# Patient Record
Sex: Male | Born: 1960 | Race: Black or African American | Hispanic: No | Smoking: Never smoker
Health system: Southern US, Community
[De-identification: ages and names within clinical notes are randomized; demographics above are authoritative.]

## PROBLEM LIST (undated history)

## (undated) DIAGNOSIS — T7840XA Allergy, unspecified, initial encounter: Secondary | ICD-10-CM

## (undated) DIAGNOSIS — T8859XA Other complications of anesthesia, initial encounter: Secondary | ICD-10-CM

## (undated) DIAGNOSIS — E119 Type 2 diabetes mellitus without complications: Secondary | ICD-10-CM

## (undated) DIAGNOSIS — G510 Bell's palsy: Secondary | ICD-10-CM

## (undated) DIAGNOSIS — E785 Hyperlipidemia, unspecified: Secondary | ICD-10-CM

## (undated) DIAGNOSIS — K648 Other hemorrhoids: Secondary | ICD-10-CM

## (undated) HISTORY — PX: COLONOSCOPY: SHX174

## (undated) HISTORY — PX: KNEE SURGERY: SHX244

## (undated) HISTORY — DX: Other hemorrhoids: K64.8

## (undated) HISTORY — DX: Allergy, unspecified, initial encounter: T78.40XA

## (undated) HISTORY — PX: HERNIA REPAIR: SHX51

## (undated) HISTORY — PX: CIRCUMCISION: SUR203

## (undated) HISTORY — DX: Bell's palsy: G51.0

## (undated) HISTORY — PX: OTHER SURGICAL HISTORY: SHX169

## (undated) HISTORY — DX: Hyperlipidemia, unspecified: E78.5

## (undated) HISTORY — DX: Type 2 diabetes mellitus without complications: E11.9

---

## 2016-02-26 DIAGNOSIS — G510 Bell's palsy: Secondary | ICD-10-CM

## 2016-02-26 HISTORY — DX: Bell's palsy: G51.0

## 2017-03-30 ENCOUNTER — Encounter (HOSPITAL_BASED_OUTPATIENT_CLINIC_OR_DEPARTMENT_OTHER): Payer: Self-pay | Admitting: *Deleted

## 2017-03-30 ENCOUNTER — Other Ambulatory Visit: Payer: Self-pay

## 2017-03-30 ENCOUNTER — Emergency Department (HOSPITAL_BASED_OUTPATIENT_CLINIC_OR_DEPARTMENT_OTHER)
Admission: EM | Admit: 2017-03-30 | Discharge: 2017-03-30 | Disposition: A | Discharge status: Home / Self Care | Payer: BLUE CROSS/BLUE SHIELD | Attending: Emergency Medicine | Admitting: Emergency Medicine

## 2017-03-30 DIAGNOSIS — K648 Other hemorrhoids: Secondary | ICD-10-CM | POA: Diagnosis not present

## 2017-03-30 DIAGNOSIS — K625 Hemorrhage of anus and rectum: Secondary | ICD-10-CM | POA: Diagnosis present

## 2017-03-30 MED ORDER — DOCUSATE SODIUM 100 MG PO CAPS: 100.0000 mg | ORAL_CAPSULE | Freq: Two times a day (BID) | ORAL | 0 refills | Status: DC

## 2017-03-30 MED ORDER — HYDROCORTISONE ACETATE 25 MG RE SUPP: 25.0000 mg | Freq: Two times a day (BID) | RECTAL | 0 refills | Status: DC

## 2017-03-30 NOTE — ED Provider Notes (Signed)
[facility] EMERGENCY DEPARTMENT Provider Note   CSN: [id number] Arrival date & time: 03/30/17  0004     History   Chief Complaint Chief Complaint  Patient presents with  . Rectal Bleeding    HPI Anthony Mills is a 57 y.o. male.  HPI  This is a 57 year old male who presents with hemorrhoids.  Patient reports that he had a hard bowel movement which he strained for yesterday.  Since that time he has noted a bulging hemorrhoid and rectal bleeding.  He has a history of the same.  He states that previously that have gone away on their own.  He took some fiber but reports pain mostly with sitting.  He is a Administrator.  He has not had any recurrent bleeding today.  Her pain is rated at 8 out of 10.  History reviewed. No pertinent past medical history.  There are no active problems to display for this patient.   History reviewed. No pertinent surgical history.     Home Medications    Prior to Admission medications   Medication Sig Start Date End Date Taking? Authorizing Provider  docusate sodium (COLACE) 100 MG capsule Take 1 capsule (100 mg total) by mouth every 12 (twelve) hours. 03/30/17   Horton, Barbette Hair, MD  hydrocortisone (ANUSOL-HC) 25 MG suppository Place 1 suppository (25 mg total) rectally 2 (two) times daily. 03/30/17   Horton, Barbette Hair, MD    Family History No family history on file.  Social History Social History   Tobacco Use  . Smoking status: Never Smoker  . Smokeless tobacco: Never Used  Substance Use Topics  . Alcohol use: No    Frequency: Never  . Drug use: No     Allergies   Codeine   Review of Systems Review of Systems  Constitutional: Negative for fever.  Gastrointestinal: Positive for anal bleeding and rectal pain. Negative for abdominal pain and diarrhea.  All other systems reviewed and are negative.    Physical Exam Updated Vital Signs BP 108/79 (BP Location: Left Arm)   Pulse 96   Temp 100.1 F (37.8 C) (Oral)    Resp 16   Ht 5\' 11"  (1.803 m)   Wt 83.9 kg (185 lb)   SpO2 96%   BMI 25.80 kg/m   Physical Exam  Constitutional: He is oriented to person, place, and time. He appears well-developed and well-nourished. No distress.  HENT:  Head: Normocephalic and atraumatic.  Cardiovascular: Normal rate, regular rhythm and normal heart sounds.  Pulmonary/Chest: Effort normal. No respiratory distress.  Abdominal: Soft. Bowel sounds are normal. There is no tenderness. There is no rebound.  Genitourinary: Rectal exam shows external hemorrhoid and tenderness. Rectal exam shows no fissure.     Genitourinary Comments: Rectal exam was notable for 2 large external hemorrhoids, no obvious thrombosis, no active bleeding, no tenderness otherwise or fluctuance adjacent to anus, digital exam deferred secondary to pain  Musculoskeletal: He exhibits no edema.  Neurological: He is alert and oriented to person, place, and time.  Skin: Skin is warm and dry.  Psychiatric: He has a normal mood and affect.  Nursing note and vitals reviewed.    ED Treatments / Results  Labs (all labs ordered are listed, but only abnormal results are displayed) Labs Reviewed - No data to display  EKG  EKG Interpretation None       Radiology No results found.  Procedures Procedures (including critical care time)  Medications Ordered in ED Medications -  No data to display   Initial Impression / Assessment and Plan / ED Course  I have reviewed the triage vital signs and the nursing notes.  Pertinent labs & imaging results that were available during my care of the patient were reviewed by me and considered in my medical decision making (see chart for details).    Patient presents with bleeding external hemorrhoids.  He is nontoxic on exam.  Vital signs reassuring.  He has 2 large external hemorrhoids that are not actively bleeding and do not appear thrombosed.  They are tender to touch.  No adjacent fluctuance  concerning for infection.  Discussed with patient supportive measures.  Recommend Colace and laxative for soft stools.  Sitz baths.  Anusol suppositories.  Surgical follow-up.  After history, exam, and medical workup I feel the patient has been appropriately medically screened and is safe for discharge home. Pertinent diagnoses were discussed with the patient. Patient was given return precautions.   Final Clinical Impressions(s) / ED Diagnoses   Final diagnoses:  Other hemorrhoids    ED Discharge Orders        Ordered    docusate sodium (COLACE) 100 MG capsule  Every 12 hours     03/30/17 0132    hydrocortisone (ANUSOL-HC) 25 MG suppository  2 times daily     03/30/17 0132       Merryl Hacker, MD 03/30/17 [phone]

## 2017-03-30 NOTE — ED Notes (Signed)
EDP into room, prior to RN assessment, see MD notes, pending orders.   

## 2017-03-30 NOTE — ED Triage Notes (Signed)
Pt reports hemorrhoid swelling after BM on Friday. Reports still having bleeding and swelling and unable to have BM due to this

## 2017-03-30 NOTE — Discharge Instructions (Signed)
You have evidence of hemorrhoids.  You need to take stool softeners and make sure your bowel movements are soft and you are not straining.  Sitz bath 2-3 times days recommended.  Use hydrocortisone suppositories.  Follow-up with general surgery.

## 2017-03-30 NOTE — ED Notes (Signed)
Alert, NAD, calm, interactive, resps e/u, speaking in clear complete sentences, no dyspnea noted, skin W&D, VSS, c/o rectal pain, rectal bleeding and hemorrhoids, (denies: sob, NVD, fever, "feeling cold", dizziness, weakness, diaphoresis or visual changes). Seen by EDP prior to RN assessment, denies questions or needs.

## 2017-04-18 ENCOUNTER — Encounter: Payer: Self-pay | Admitting: Gastroenterology

## 2017-04-25 ENCOUNTER — Other Ambulatory Visit: Payer: Self-pay

## 2017-04-25 ENCOUNTER — Ambulatory Visit (AMBULATORY_SURGERY_CENTER): Payer: Self-pay

## 2017-04-25 VITALS — Ht 72.0 in | Wt 188.8 lb

## 2017-04-25 DIAGNOSIS — Z1211 Encounter for screening for malignant neoplasm of colon: Secondary | ICD-10-CM

## 2017-04-25 DIAGNOSIS — G510 Bell's palsy: Secondary | ICD-10-CM

## 2017-04-25 MED ORDER — NA SULFATE-K SULFATE-MG SULF 17.5-3.13-1.6 GM/177ML PO SOLN: 1.0000 | Freq: Once | ORAL | 0 refills | Status: AC

## 2017-05-09 ENCOUNTER — Encounter: Payer: BLUE CROSS/BLUE SHIELD | Admitting: Gastroenterology

## 2017-07-03 DIAGNOSIS — Z8669 Personal history of other diseases of the nervous system and sense organs: Secondary | ICD-10-CM | POA: Insufficient documentation

## 2017-07-18 ENCOUNTER — Encounter: Payer: BLUE CROSS/BLUE SHIELD | Admitting: Gastroenterology

## 2017-09-18 ENCOUNTER — Encounter: Payer: BLUE CROSS/BLUE SHIELD | Admitting: Gastroenterology

## 2018-01-09 ENCOUNTER — Ambulatory Visit (AMBULATORY_SURGERY_CENTER): Payer: Self-pay | Admitting: *Deleted

## 2018-01-09 ENCOUNTER — Other Ambulatory Visit: Payer: Self-pay

## 2018-01-09 VITALS — Ht 70.0 in | Wt 190.8 lb

## 2018-01-09 DIAGNOSIS — Z1211 Encounter for screening for malignant neoplasm of colon: Secondary | ICD-10-CM

## 2018-01-09 MED ORDER — SUPREP BOWEL PREP KIT 17.5-3.13-1.6 GM/177ML PO SOLN
1.0000 | Freq: Once | ORAL | 0 refills | Status: AC
Start: 2018-01-09 — End: 2018-01-09

## 2018-01-09 NOTE — Progress Notes (Signed)
Patient denies any allergies to egg or soy products. Patient denies complications with anesthesia/sedation.  Patient denies oxygen use at home and denies diet medications. Pamphlet given on colonoscopy. 

## 2018-01-12 ENCOUNTER — Encounter: Payer: Self-pay | Admitting: Gastroenterology

## 2018-01-16 ENCOUNTER — Encounter: Payer: BLUE CROSS/BLUE SHIELD | Admitting: Gastroenterology

## 2018-02-10 ENCOUNTER — Other Ambulatory Visit: Payer: Self-pay

## 2018-02-10 ENCOUNTER — Telehealth: Payer: Self-pay | Admitting: Gastroenterology

## 2018-02-10 NOTE — Telephone Encounter (Signed)
Letter mailed

## 2018-02-20 ENCOUNTER — Other Ambulatory Visit: Payer: Self-pay

## 2018-02-20 ENCOUNTER — Encounter: Payer: BLUE CROSS/BLUE SHIELD | Admitting: Gastroenterology

## 2018-02-20 NOTE — Telephone Encounter (Signed)
Patient rescheduled his procedure to 1.10.2020 and would like this letter rewrote for work. Patient states he can come by today and pick it up.

## 2018-02-20 NOTE — Telephone Encounter (Signed)
Patient calling back about this to see if he can come by today to pick up updated letter. States he needs a letter to be off day before and day of procedure. Best call back# [phone].

## 2018-02-20 NOTE — Telephone Encounter (Signed)
Letter re-written. Patient contacted. He is coming for the letter.

## 2018-02-26 ENCOUNTER — Encounter: Payer: Self-pay | Admitting: Gastroenterology

## 2018-02-26 ENCOUNTER — Other Ambulatory Visit: Payer: Self-pay

## 2018-02-26 NOTE — Telephone Encounter (Signed)
Pt states now that colon appt has been moved to 03/13/18 needs a new letter stating last appointment was moved due to construction and has been moved to 03/13/18. Pt is asking we mail updated letter to him.

## 2018-02-26 NOTE — Telephone Encounter (Signed)
New letter mailed.

## 2018-02-27 ENCOUNTER — Encounter: Payer: BLUE CROSS/BLUE SHIELD | Admitting: Gastroenterology

## 2018-03-06 ENCOUNTER — Encounter: Payer: BLUE CROSS/BLUE SHIELD | Admitting: Gastroenterology

## 2018-03-13 ENCOUNTER — Ambulatory Visit (AMBULATORY_SURGERY_CENTER): Payer: BLUE CROSS/BLUE SHIELD | Admitting: Gastroenterology

## 2018-03-13 ENCOUNTER — Encounter: Payer: Self-pay | Admitting: Gastroenterology

## 2018-03-13 VITALS — BP 100/81 | HR 70 | Temp 98.7°F | Resp 14

## 2018-03-13 DIAGNOSIS — Z1211 Encounter for screening for malignant neoplasm of colon: Secondary | ICD-10-CM | POA: Diagnosis not present

## 2018-03-13 MED ORDER — FLEET ENEMA 7-19 GM/118ML RE ENEM: 1.0000 | ENEMA | Freq: Once | RECTAL | 0 refills | Status: AC

## 2018-03-13 NOTE — Progress Notes (Signed)
Pt. Not awake when Dr. Havery Moros came to give report to the pt.   Kept pt. For consult when Dr. Havery Moros available after subsequent procedure.  Hence disparity in discharge time.

## 2018-03-13 NOTE — Progress Notes (Signed)
PT taken to PACU. Monitors in place. VSS. Report given to RN. 

## 2018-03-13 NOTE — Patient Instructions (Signed)
Impression/Recommendations:  Hemorrhoid handout given to patient.  Resume previous diet. Continue present medications.  Repeat colonoscopy within the next year because the bowel preparation was suboptimal.  YOU HAD AN ENDOSCOPIC PROCEDURE TODAY AT [facility]:   Refer to the procedure report that was given to you for any specific questions about what was found during the examination.  If the procedure report does not answer your questions, please call your gastroenterologist to clarify.  If you requested that your care partner not be given the details of your procedure findings, then the procedure report has been included in a sealed envelope for you to review at your convenience later.  YOU SHOULD EXPECT: Some feelings of bloating in the abdomen. Passage of more gas than usual.  Walking can help get rid of the air that was put into your GI tract during the procedure and reduce the bloating. If you had a lower endoscopy (such as a colonoscopy or flexible sigmoidoscopy) you may notice spotting of blood in your stool or on the toilet paper. If you underwent a bowel prep for your procedure, you may not have a normal bowel movement for a few days.  Please Note:  You might notice some irritation and congestion in your nose or some drainage.  This is from the oxygen used during your procedure.  There is no need for concern and it should clear up in a day or so.  SYMPTOMS TO REPORT IMMEDIATELY:   Following lower endoscopy (colonoscopy or flexible sigmoidoscopy):  Excessive amounts of blood in the stool  Significant tenderness or worsening of abdominal pains  Swelling of the abdomen that is new, acute  Fever of 100F or higher For urgent or emergent issues, a gastroenterologist can be reached at any hour by calling [phone].   DIET:  We do recommend a small meal at first, but then you may proceed to your regular diet.  Drink plenty of fluids but you should avoid alcoholic  beverages for 24 hours.  ACTIVITY:  You should plan to take it easy for the rest of today and you should NOT DRIVE or use heavy machinery until tomorrow (because of the sedation medicines used during the test).    FOLLOW UP: Our staff will call the number listed on your records the next business day following your procedure to check on you and address any questions or concerns that you may have regarding the information given to you following your procedure. If we do not reach you, we will leave a message.  However, if you are feeling well and you are not experiencing any problems, there is no need to return our call.  We will assume that you have returned to your regular daily activities without incident.  If any biopsies were taken you will be contacted by phone or by letter within the next 1-3 weeks.  Please call us at [phone] if you have not heard about the biopsies in 3 weeks.    SIGNATURES/CONFIDENTIALITY: You and/or your care partner have signed paperwork which will be entered into your electronic medical record.  These signatures attest to the fact that that the information above on your After Visit Summary has been reviewed and is understood.  Full responsibility of the confidentiality of this discharge information lies with you and/or your care-partner.

## 2018-03-13 NOTE — Op Note (Signed)
[facility] Patient Name: Unknown Schleyer Procedure Date: 03/13/2018 9:42 AM MRN: [id number] Endoscopist: Remo Lipps P. Havery Moros , MD Age: 58 Referring MD:  Date of Birth: Jul 06, 1960 Gender: Male Account #: [id number] Procedure:                Colonoscopy Indications:              Screening for colorectal malignant neoplasm Medicines:                Monitored Anesthesia Care Procedure:                Pre-Anesthesia Assessment:                           - Prior to the procedure, a History and Physical                            was performed, and patient medications and                            allergies were reviewed. The patient's tolerance of                            previous anesthesia was also reviewed. The risks                            and benefits of the procedure and the sedation                            options and risks were discussed with the patient.                            All questions were answered, and informed consent                            was obtained. Prior Anticoagulants: The patient has                            taken no previous anticoagulant or antiplatelet                            agents. ASA Grade Assessment: II - A patient with                            mild systemic disease. After reviewing the risks                            and benefits, the patient was deemed in                            satisfactory condition to undergo the procedure.                           After obtaining informed consent, the colonoscope  was passed under direct vision. Throughout the                            procedure, the patient's blood pressure, pulse, and                            oxygen saturations were monitored continuously. The                            Colonoscope was introduced through the anus and                            advanced to the the cecum, identified by                            appendiceal orifice and  ileocecal valve. The                            colonoscopy was performed without difficulty. The                            patient tolerated the procedure well. The quality                            of the bowel preparation was fair. The ileocecal                            valve, appendiceal orifice, and rectum were                            photographed. Scope In: 9:49:16 AM Scope Out: 10:10:47 AM Scope Withdrawal Time: 0 hours 17 minutes 54 seconds  Total Procedure Duration: 0 hours 21 minutes 31 seconds  Findings:                 The perianal and digital rectal examinations were                            normal.                           A large amount of semi-liquid stool was found in                            the entire colon, making visualization difficult.                            Lavage of the area was performed using copious                            amounts of sterile water, resulting in clearance in                            most areas with fair visualization, a few areas  were difficult to visualize, particularly the cecal                            cap.                           Internal hemorrhoids were found during                            retroflexion. The hemorrhoids were moderate.                           The exam was otherwise without abnormality. No                            polyps Complications:            No immediate complications. Estimated blood loss:                            None. Estimated Blood Loss:     Estimated blood loss: none. Impression:               - Preparation of the colon was fair.                           - Internal hemorrhoids.                           - The examination was otherwise normal.                           Overall no large polyps or mass lesions, however                            small or very flat lesions may not have been                            appreciated given  preparation. Recommendation:           - Patient has a contact number available for                            emergencies. The signs and symptoms of potential                            delayed complications were discussed with the                            patient. Return to normal activities tomorrow.                            Written discharge instructions were provided to the                            patient.                           -  Resume previous diet.                           - Continue present medications.                           - Repeat colonoscopy within the next year or so                            because the bowel preparation was suboptimal. Remo Lipps P. Armbruster, MD 03/13/2018 10:15:59 AM This report has been signed electronically.

## 2018-03-16 ENCOUNTER — Telehealth: Payer: Self-pay

## 2018-03-16 NOTE — Telephone Encounter (Signed)
Called pt.

## 2018-03-16 NOTE — Telephone Encounter (Signed)
Called [phone] and left a messaged we tried to reach pt for a follow up call. maw

## 2018-03-16 NOTE — Telephone Encounter (Signed)
He needs a Friday appt. Scheduled him for 04-17-18 at 3:30pm. Will wait for the March schedule to open to schedule more banding appts on Fridays

## 2018-03-16 NOTE — Telephone Encounter (Signed)
Follow up call made, name identifier, left a voicemail. 

## 2018-03-16 NOTE — Telephone Encounter (Signed)
-----   Message from Yetta Flock, MD sent at 03/13/2018  1:30 PM EST ----- Regarding: banding Jan would you mind helping coordinate a banding for this patient? No rush, can call next week. thanks

## 2018-03-18 DIAGNOSIS — D8683 Sarcoid iridocyclitis: Secondary | ICD-10-CM | POA: Insufficient documentation

## 2018-03-18 DIAGNOSIS — H3581 Retinal edema: Secondary | ICD-10-CM | POA: Insufficient documentation

## 2018-03-18 DIAGNOSIS — H30033 Focal chorioretinal inflammation, peripheral, bilateral: Secondary | ICD-10-CM | POA: Insufficient documentation

## 2018-03-18 DIAGNOSIS — D869 Sarcoidosis, unspecified: Secondary | ICD-10-CM | POA: Insufficient documentation

## 2018-03-18 DIAGNOSIS — H3023 Posterior cyclitis, bilateral: Secondary | ICD-10-CM | POA: Insufficient documentation

## 2018-03-18 DIAGNOSIS — H35063 Retinal vasculitis, bilateral: Secondary | ICD-10-CM | POA: Insufficient documentation

## 2018-03-18 DIAGNOSIS — H524 Presbyopia: Secondary | ICD-10-CM | POA: Insufficient documentation

## 2018-03-18 DIAGNOSIS — H16223 Keratoconjunctivitis sicca, not specified as Sjogren's, bilateral: Secondary | ICD-10-CM | POA: Insufficient documentation

## 2018-03-18 DIAGNOSIS — H2513 Age-related nuclear cataract, bilateral: Secondary | ICD-10-CM | POA: Insufficient documentation

## 2018-03-18 DIAGNOSIS — J8410 Pulmonary fibrosis, unspecified: Secondary | ICD-10-CM | POA: Insufficient documentation

## 2018-03-18 HISTORY — DX: Sarcoid iridocyclitis: D86.83

## 2018-04-17 ENCOUNTER — Encounter: Payer: BLUE CROSS/BLUE SHIELD | Admitting: Gastroenterology

## 2018-07-28 ENCOUNTER — Ambulatory Visit: Payer: BC Managed Care – PPO | Admitting: Pediatrics

## 2018-07-28 ENCOUNTER — Other Ambulatory Visit: Payer: Self-pay

## 2018-07-28 ENCOUNTER — Encounter: Payer: Self-pay | Admitting: Pediatrics

## 2018-07-28 VITALS — BP 116/70 | HR 82 | Temp 98.5°F | Resp 16 | Ht 70.16 in | Wt 184.8 lb

## 2018-07-28 DIAGNOSIS — J301 Allergic rhinitis due to pollen: Secondary | ICD-10-CM

## 2018-07-28 DIAGNOSIS — H1010 Acute atopic conjunctivitis, unspecified eye: Secondary | ICD-10-CM | POA: Diagnosis not present

## 2018-07-28 MED ORDER — FLUTICASONE PROPIONATE 50 MCG/ACT NA SUSP: NASAL | 5 refills | Status: DC

## 2018-07-28 NOTE — Progress Notes (Signed)
[facility] [address] Dept: [phone]  New Patient Note  Patient ID: Anthony Mills, male    DOB: [birth year]  Age: 58 y.o. MRN: [id number] Date of Office Visit: 07/28/2018 Referring provider: No referring provider defined for this encounter.    Chief Complaint: Allergic Rhinitis   HPI [name] presents for evaluation of allergic rhinitis.  He began to have allergic rhinitis in 1997.  His symptoms are worse in the springtime and also associated with a runny nose, stuffy nose, sneezing.  He also has itchy burning eyes in the springtime.  He has used Naphcon-A eyedrops with some improvement. His symptoms have been very severe this  springtime and have not resolved as usual.  He has aggravation of his nasal symptoms on exposure to the springtime and exposure to dust.  He has not had asthmatic symptoms.  There is no history of eczema, chronic urticaria or gastroesophageal reflux.  Review of Systems  Constitutional: Negative.   HENT:       Seasonal allergic rhinitis since 1997  Eyes:       Itchy eyes in the springtime  Respiratory: Negative.   Cardiovascular: Negative.   Gastrointestinal: Negative.   Genitourinary: Negative.   Musculoskeletal:       Knee surgery in the past.  Umbilical hernia repair  Skin: Negative.   Neurological: Negative.   Endo/Heme/Allergies:       No diabetes or thyroid disease  Psychiatric/Behavioral: Negative.     Outpatient Encounter Medications as of 07/28/2018  Medication Sig  . Naphazoline-Pheniramine (ALLERGY EYE OP) Apply to eye daily.  . fluticasone (FLONASE) 50 MCG/ACT nasal spray 2 sprays per nostril once a day if needed for stuffy nose  . [DISCONTINUED] docusate sodium (COLACE) 100 MG capsule Take 1 capsule (100 mg total) by mouth every 12 (twelve) hours. (Patient not taking: Reported on 01/09/2018)  . [DISCONTINUED] omeprazole (PRILOSEC) 40 MG capsule   . [DISCONTINUED] prednisoLONE acetate (PRED FORTE) 1 %  ophthalmic suspension   . [DISCONTINUED] rosuvastatin (CRESTOR) 20 MG tablet Take by mouth.   No facility-administered encounter medications on file as of 07/28/2018.      Drug Allergies:  Allergies  Allergen Reactions  . Codeine Other (See Comments)    hallucinate    Family History: Chadley's family history includes Asthma in his nephew..  Family history is negative for hay fever, sinus problems, angioedema, eczema, hives, food allergies, chronic bronchitis or emphysema.  Social and environmental.  There are no pets in the home.  He is not exposed to cigarette smoking.  He smoked for 1 year in the past when he was 58 years of age.  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 He is normal weight.  HENT:     Head:     Comments: Eyes showed mild erythema of the palpebral conjunctiva .  Ears normal.  Nose mild swelling of the nasal turbinates.  Pharynx normal. Neck:     Musculoskeletal: Neck supple.     Comments: Thyroid normal Cardiovascular:     Rate and Rhythm: Normal rate and regular rhythm.     Comments: S1-S2 normal no  murmurs Pulmonary:     Comments: Clear to percussion and auscultation Abdominal:     Palpations: Abdomen is soft.     Tenderness: There is no abdominal tenderness.     Comments: No hepatosplenomegaly  Lymphadenopathy:     Cervical: No cervical adenopathy.  Skin:    Comments: Clear  Neurological:     General: No focal deficit present.     Mental Status: He is alert and oriented to person, place, and time.  Psychiatric:        Mood and Affect: Mood normal.        Behavior: Behavior normal.        Thought Content: Thought content normal.        Judgment: Judgment normal.     Diagnostics: Allergy skin test were positive  to grass pollens, tree pollens, molds, dust mites   Assessment  Assessment and Plan: 1. Seasonal allergic rhinitis due to pollen   2. Seasonal allergic conjunctivitis     Meds ordered this encounter  Medications  . fluticasone (FLONASE) 50 MCG/ACT nasal spray    Sig: 2 sprays per nostril once a day if needed for stuffy nose    Dispense:  16 g    Refill:  5    Patient Instructions  Environmental control of dust mite and mold Allegra 180 mg-take 1 tablet once a day for runny nose or itchy eyes Fluticasone 2 sprays per nostril once a day if needed for stuffy nose Opcon-A-1 drop 3 times a day if needed for itchy eyes Add prednisone 10 mg-take 2 tablets twice a day for 3 days, 2 tablets on the fourth day, 1 tablet on the fifth day to bring your allergic symptoms under control Call us if you are not doing well on this treatment plan   Return in about 4 weeks (around 08/25/2018).   Thank you for the opportunity to care for this patient.  Please do not hesitate to contact me with questions.  Penne Lash, M.D.  [facility] [address] [place],  [address] [phone]

## 2018-07-28 NOTE — Patient Instructions (Addendum)
Environmental control of dust mite and mold Allegra 180 mg-take 1 tablet once a day for runny nose or itchy eyes Fluticasone 2 sprays per nostril once a day if needed for stuffy nose Opcon-A-1 drop 3 times a day if needed for itchy eyes Add prednisone 10 mg-take 2 tablets twice a day for 3 days, 2 tablets on the fourth day, 1 tablet on the fifth day to bring your allergic symptoms under control Call us if you are not doing well on this treatment plan

## 2018-08-11 ENCOUNTER — Ambulatory Visit: Payer: BC Managed Care – PPO | Admitting: Pediatrics

## 2018-08-24 ENCOUNTER — Other Ambulatory Visit: Payer: Self-pay

## 2018-08-24 ENCOUNTER — Encounter: Payer: Self-pay | Admitting: Pediatrics

## 2018-08-24 ENCOUNTER — Ambulatory Visit: Payer: BC Managed Care – PPO | Admitting: Pediatrics

## 2018-08-24 VITALS — BP 112/70 | HR 102 | Temp 97.7°F | Resp 20

## 2018-08-24 DIAGNOSIS — J4531 Mild persistent asthma with (acute) exacerbation: Secondary | ICD-10-CM

## 2018-08-24 DIAGNOSIS — J301 Allergic rhinitis due to pollen: Secondary | ICD-10-CM

## 2018-08-24 DIAGNOSIS — H1010 Acute atopic conjunctivitis, unspecified eye: Secondary | ICD-10-CM | POA: Diagnosis not present

## 2018-08-24 MED ORDER — MONTELUKAST SODIUM 10 MG PO TABS: ORAL_TABLET | ORAL | 5 refills | Status: DC

## 2018-08-24 MED ORDER — ALBUTEROL SULFATE HFA 108 (90 BASE) MCG/ACT IN AERS: INHALATION_SPRAY | RESPIRATORY_TRACT | 1 refills | Status: DC

## 2018-08-24 MED ORDER — AMOXICILLIN-POT CLAVULANATE 875-125 MG PO TABS: ORAL_TABLET | ORAL | 0 refills | Status: DC

## 2018-08-24 NOTE — Patient Instructions (Addendum)
Allegra 180 mg-take 1 tablet once a day for runny nose or itchy eyes Nasal saline irrigations at night followed by fluticasone 2 sprays per nostril at night Opcon-A-1 drop 3 times a day if needed for itchy eyes Montelukast 10 mg-take 1 tablet once a day to prevent coughing or wheezing Pro-air 2 puffs every 4 hours if needed for wheezing or coughing spells.  You may use Pro-air 2 puffs 5 to 15 minutes before exercise Add prednisone 10 mg tablet-take 2 tablets twice a day for 3 days, 2 tablets on the fourth day, 1 tablet on the fifth day Augmentin 875 mg-take 1 tablet every 12 hours for 10 days for infection Continue on  your other medications Call us if you are not doing well on this treatment plan

## 2018-08-24 NOTE — Progress Notes (Signed)
[address] [place]  [address] Dept: [phone]  FOLLOW UP NOTE  Patient ID: Anthony Mills, male    DOB: March 11, 1960  Age: 58 y.o. MRN: [id number] Date of Office Visit: 08/24/2018  Assessment  Chief Complaint: Allergic Rhinitis   HPI Anthony Mills presents for follow-up of allergic rhinitis.  While he was on prednisone , the sinus pressure improved but then it  got worse.  He is on Allegra 180 mg once a day and fluticasone 2 sprays per nostril once a day.  Over the past 2 weeks he has had a cough  ,some chest congestion and some noises coming out of his chest.  He has never had a history of asthma.   Drug Allergies:  Allergies  Allergen Reactions  . Codeine Other (See Comments)    hallucinate    Physical Exam: BP 112/70   Pulse (!) 102   Temp 97.7 F (36.5 C) (Oral)   Resp 20   SpO2 97%    Physical Exam Constitutional:      Appearance: Normal appearance. He is normal weight.  HENT:     Head:     Comments: Eyes normal.  Ears normal.  Nose mild swelling nasal turbinates.  Pharynx normal., except for yellow postnasal drainage Neck:     Musculoskeletal: Neck supple.  Cardiovascular:     Comments: S1-S2 normal no murmur Pulmonary:     Comments: Clear to percussion auscultation except for some rhonchi in both lungs Lymphadenopathy:     Cervical: No cervical adenopathy.  Neurological:     General: No focal deficit present.     Mental Status: He is alert and oriented to person, place, and time.  Psychiatric:        Mood and Affect: Mood normal.        Behavior: Behavior normal.        Thought Content: Thought content normal.        Judgment: Judgment normal.     Diagnostics: FVC 2.98 L FEV1 2.36 L.  Predicted FVC 4.08 L predicted FEV1 3.21 L.  After albuterol 4 puffs FVC 3.26 L FEV1 2.69 L-this shows a mild reduction in the forced vital capacity.  There was significant improvement in the forced vital capacity and FEV1 after albuterol  Assessment and  Plan: 1. Mild persistent asthma with acute exacerbation   2. Seasonal allergic conjunctivitis   3. Seasonal allergic rhinitis due to pollen     Meds ordered this encounter  Medications  . montelukast (SINGULAIR) 10 MG tablet    Sig: Take 1 tablet once a day to prevent coughing or wheezing    Dispense:  30 tablet    Refill:  5  . albuterol (PROAIR HFA) 108 (90 Base) MCG/ACT inhaler    Sig: 2 puffs every 4 hours if needed for coughing or wheezing spells    Dispense:  18 g    Refill:  1  . amoxicillin-clavulanate (AUGMENTIN) 875-125 MG tablet    Sig: Take one tablet every 12 hours for 10 days for infection    Dispense:  20 tablet    Refill:  0    Patient Instructions  Allegra 180 mg-take 1 tablet once a day for runny nose or itchy eyes Nasal saline irrigations at night followed by fluticasone 2 sprays per nostril at night Opcon-A-1 drop 3 times a day if needed for itchy eyes Montelukast 10 mg-take 1 tablet once a day to prevent coughing or wheezing Pro-air 2 puffs every 4  hours if needed for wheezing or coughing spells.  You may use Pro-air 2 puffs 5 to 15 minutes before exercise Add prednisone 10 mg tablet-take 2 tablets twice a day for 3 days, 2 tablets on the fourth day, 1 tablet on the fifth day Augmentin 875 mg-take 1 tablet every 12 hours for 10 days for infection Continue on  your other medications Call us if you are not doing well on this treatment plan   Return in about 2 weeks (around 09/07/2018).    Thank you for the opportunity to care for this patient.  Please do not hesitate to contact me with questions.  Penne Lash, M.D.  [facility] [address] [place], [place] [address] [phone]

## 2018-09-01 ENCOUNTER — Ambulatory Visit: Payer: BC Managed Care – PPO | Admitting: Pediatrics

## 2018-09-08 ENCOUNTER — Ambulatory Visit: Payer: BC Managed Care – PPO | Admitting: Pediatrics

## 2018-09-11 DIAGNOSIS — R519 Headache, unspecified: Secondary | ICD-10-CM | POA: Insufficient documentation

## 2018-09-11 DIAGNOSIS — R251 Tremor, unspecified: Secondary | ICD-10-CM | POA: Insufficient documentation

## 2018-09-23 DIAGNOSIS — N50819 Testicular pain, unspecified: Secondary | ICD-10-CM

## 2018-09-23 HISTORY — DX: Testicular pain, unspecified: N50.819

## 2018-09-29 ENCOUNTER — Encounter: Payer: Self-pay | Admitting: Gastroenterology

## 2018-10-09 ENCOUNTER — Encounter: Payer: Self-pay | Admitting: Gastroenterology

## 2018-10-16 ENCOUNTER — Other Ambulatory Visit: Payer: Self-pay

## 2018-10-16 ENCOUNTER — Ambulatory Visit (AMBULATORY_SURGERY_CENTER): Payer: Self-pay | Admitting: *Deleted

## 2018-10-16 VITALS — Temp 97.1°F | Ht 71.0 in | Wt 180.0 lb

## 2018-10-16 DIAGNOSIS — K219 Gastro-esophageal reflux disease without esophagitis: Secondary | ICD-10-CM | POA: Insufficient documentation

## 2018-10-16 DIAGNOSIS — Z1211 Encounter for screening for malignant neoplasm of colon: Secondary | ICD-10-CM

## 2018-10-16 DIAGNOSIS — R1314 Dysphagia, pharyngoesophageal phase: Secondary | ICD-10-CM | POA: Insufficient documentation

## 2018-10-16 MED ORDER — SUPREP BOWEL PREP KIT 17.5-3.13-1.6 GM/177ML PO SOLN: 1.0000 | Freq: Once | ORAL | 0 refills | Status: AC

## 2018-10-16 NOTE — Progress Notes (Signed)
No egg or soy allergy known to patient  No issues with past sedation with any surgeries  or procedures, no intubation problems  No diet pills per patient No home 02 use per patient  No blood thinners per patient  Pt denies issues with constipation  No A fib or A flutter  EMMI video sent to pt's e mail   Suprep $15 coupon to pt in PV today    

## 2018-10-22 ENCOUNTER — Other Ambulatory Visit: Payer: Self-pay

## 2018-10-22 DIAGNOSIS — Z20822 Contact with and (suspected) exposure to covid-19: Secondary | ICD-10-CM

## 2018-10-23 ENCOUNTER — Encounter: Payer: BLUE CROSS/BLUE SHIELD | Admitting: Gastroenterology

## 2018-10-24 LAB — NOVEL CORONAVIRUS, NAA: SARS-CoV-2, NAA: NOT DETECTED

## 2018-10-25 ENCOUNTER — Encounter (HOSPITAL_BASED_OUTPATIENT_CLINIC_OR_DEPARTMENT_OTHER): Payer: Self-pay | Admitting: Emergency Medicine

## 2018-10-25 ENCOUNTER — Emergency Department (HOSPITAL_BASED_OUTPATIENT_CLINIC_OR_DEPARTMENT_OTHER): Payer: BC Managed Care – PPO

## 2018-10-25 ENCOUNTER — Emergency Department (HOSPITAL_BASED_OUTPATIENT_CLINIC_OR_DEPARTMENT_OTHER)
Admission: EM | Admit: 2018-10-25 | Discharge: 2018-10-25 | Disposition: A | Discharge status: Home / Self Care | Payer: BC Managed Care – PPO | Attending: Emergency Medicine | Admitting: Emergency Medicine

## 2018-10-25 ENCOUNTER — Other Ambulatory Visit: Payer: Self-pay

## 2018-10-25 DIAGNOSIS — R51 Headache: Secondary | ICD-10-CM | POA: Diagnosis not present

## 2018-10-25 DIAGNOSIS — E785 Hyperlipidemia, unspecified: Secondary | ICD-10-CM | POA: Diagnosis not present

## 2018-10-25 DIAGNOSIS — Z79899 Other long term (current) drug therapy: Secondary | ICD-10-CM | POA: Insufficient documentation

## 2018-10-25 DIAGNOSIS — Z885 Allergy status to narcotic agent status: Secondary | ICD-10-CM | POA: Diagnosis not present

## 2018-10-25 DIAGNOSIS — R202 Paresthesia of skin: Secondary | ICD-10-CM | POA: Diagnosis not present

## 2018-10-25 DIAGNOSIS — R519 Headache, unspecified: Secondary | ICD-10-CM

## 2018-10-25 LAB — BASIC METABOLIC PANEL
Anion gap: 11 (ref 5–15)
BUN: 10 mg/dL (ref 6–20)
CO2: 24 mmol/L (ref 22–32)
Calcium: 9.4 mg/dL (ref 8.9–10.3)
Chloride: 108 mmol/L (ref 98–111)
Creatinine, Ser: 1.09 mg/dL (ref 0.61–1.24)
GFR calc Af Amer: >60 mL/min (ref >60)
GFR calc non Af Amer: >60 mL/min (ref >60)
Glucose, Bld: 121 mg/dL — ABNORMAL HIGH (ref 70–99)
Potassium: 4 mmol/L (ref 3.5–5.1)
Sodium: 143 mmol/L (ref 135–145)

## 2018-10-25 LAB — CBC WITH DIFFERENTIAL/PLATELET
Abs Immature Granulocytes: 0.05 10*3/uL (ref 0.00–0.07)
Basophils Absolute: 0.1 10*3/uL (ref 0.0–0.1)
Basophils Relative: 1 %
Eosinophils Absolute: 0.1 10*3/uL (ref 0.0–0.5)
Eosinophils Relative: 2 %
HCT: 44.7 % (ref 39.0–52.0)
Hemoglobin: 14.1 g/dL (ref 13.0–17.0)
Immature Granulocytes: 1 %
Lymphocytes Relative: 19 %
Lymphs Abs: 1 10*3/uL (ref 0.7–4.0)
MCH: 26.4 pg (ref 26.0–34.0)
MCHC: 31.5 g/dL (ref 30.0–36.0)
MCV: 83.6 fL (ref 80.0–100.0)
Monocytes Absolute: 0.7 10*3/uL (ref 0.1–1.0)
Monocytes Relative: 13 %
Neutro Abs: 3.4 10*3/uL (ref 1.7–7.7)
Neutrophils Relative %: 64 %
Platelets: 278 10*3/uL (ref 150–400)
RBC: 5.35 MIL/uL (ref 4.22–5.81)
RDW: 13.4 % (ref 11.5–15.5)
WBC: 5.2 10*3/uL (ref 4.0–10.5)
nRBC: 0 % (ref 0.0–0.2)

## 2018-10-25 MED ORDER — DIPHENHYDRAMINE HCL 50 MG/ML IJ SOLN
12.5000 mg | Freq: Once | INTRAMUSCULAR | Status: AC
  Administered 2018-10-25: 12.5 mg via INTRAVENOUS
  Filled 2018-10-25: qty 1

## 2018-10-25 MED ORDER — METOCLOPRAMIDE HCL 5 MG/ML IJ SOLN
10.0000 mg | Freq: Once | INTRAMUSCULAR | Status: AC
  Administered 2018-10-25: 14:00:00 10 mg via INTRAVENOUS
  Filled 2018-10-25: qty 2

## 2018-10-25 MED ORDER — SODIUM CHLORIDE 0.9 % IV BOLUS
500.0000 mL | Freq: Once | INTRAVENOUS | Status: AC
  Administered 2018-10-25: 14:00:00 500 mL via INTRAVENOUS

## 2018-10-25 NOTE — ED Triage Notes (Signed)
Headache for 2 weeks with tongue and jaw numbness. Has been seen by PCP with a full work up and nothing acute found.

## 2018-10-25 NOTE — Discharge Instructions (Addendum)
You have been diagnosed today with Headache.  At this time there does not appear to be the presence of an emergent medical condition, however there is always the potential for conditions to change. Please read and follow the below instructions.  Please return to the Emergency Department immediately for any new or worsening symptoms. Please be sure to follow up with your Primary Care Provider within one week regarding your visit today; please call their office to schedule an appointment even if you are feeling better for a follow-up visit. Please call your neurologist tomorrow to schedule follow-up appointment regarding your ED visit today.  Get help right away if: Your headache gets very bad quickly. Your headache gets worse after a lot of physical activity. You keep throwing up. You have a stiff neck. You have trouble seeing. You have trouble speaking. You have pain in the eye or ear. Your muscles are weak or you lose muscle control. You lose your balance or have trouble walking. You feel like you will pass out (faint) or you pass out. You are mixed up (confused). You have a seizure. You have any new/concerning or worsening symptoms.  Please read the additional information packets attached to your discharge summary.  Do not take your medicine if  develop an itchy rash, swelling in your mouth or lips, or difficulty breathing; call 911 and seek immediate emergency medical attention if this occurs.

## 2018-10-25 NOTE — ED Provider Notes (Signed)
[facility] EMERGENCY DEPARTMENT Provider Note   CSN: [id number] Arrival date & time: 10/25/18  1138     History   Chief Complaint Chief Complaint  Patient presents with  . Headache    HPI Anthony Mills is a 58 y.o. male with history of Bell's palsy, sarcoidosis, hyperlipidemia presents today for headache and tingling sensation.  Patient reports 2-week history of headache a primarily frontal throbbing sensation severe in intensity daily/constant without clear aggravating factors and without alleviating factors, attempted OTC anti-inflammatories without relief.  He reports pain will occasionally radiate to the back of his head.  Patient reports history of headaches and reports headaches have felt similar in the past but not this severe.  Associated symptoms of mouth and jaw tingling sensation x7 days.  Reports gradual onset which is been intermittent he describes tingling to bilateral parts of his mouth, tongue and jaw he reports he has never had similar prior he denies any numbness or weakness.  He does report history of Bell's palsy but reports this feels very different and is bilateral instead of unilateral.  Patient denies injury/trauma, fever/chills, neck pain/stiffness, vision changes, difficulty speaking, facial asymmetry, chest pain/shortness breath, cough, abdominal pain, nausea/vomiting or any additional concerns.     HPI  Past Medical History:  Diagnosis Date  . Allergy   . Bell's palsy   . Bell's palsy 2018  . Hyperlipidemia     Patient Active Problem List   Diagnosis Date Noted  . Seasonal allergic rhinitis due to pollen 07/28/2018  . Seasonal allergic conjunctivitis 07/28/2018  . Bell's palsy     Past Surgical History:  Procedure Laterality Date  . CIRCUMCISION    . COLONOSCOPY    . HERNIA REPAIR    . KNEE SURGERY Left   . left wrist surgery          Home Medications    Prior to Admission medications   Medication Sig Start Date End  Date Taking? Authorizing Provider  rosuvastatin (CRESTOR) 20 MG tablet Take 20 mg by mouth daily.    [provider]  zonisamide (ZONEGRAN) 50 MG capsule Start 1 hs and increase as tolerated to 4 hs 09/11/18   [provider]    Family History Family History  Problem Relation Age of Onset  . Asthma Nephew   . Colon cancer Neg Hx   . Esophageal cancer Neg Hx   . Liver cancer Neg Hx   . Pancreatic cancer Neg Hx   . Rectal cancer Neg Hx   . Stomach cancer Neg Hx   . Colon polyps Neg Hx     Social History Social History   Tobacco Use  . Smoking status: Never Smoker  . Smokeless tobacco: Never Used  Substance Use Topics  . Alcohol use: No    Frequency: Never  . Drug use: No     Allergies   Codeine   Review of Systems Review of Systems Ten systems are reviewed and are negative for acute change except as noted in the HPI  Physical Exam Updated Vital Signs BP 100/72   Pulse 73   Temp 98.7 F (37.1 C) (Oral)   Resp 18   Ht '5\' 10"'  (1.778 m)   Wt 82.6 kg   SpO2 99%   BMI 26.11 kg/m   Physical Exam Constitutional:      General: He is not in acute distress.    Appearance: Normal appearance. He is well-developed. He is not ill-appearing or diaphoretic.  HENT:     Head: Normocephalic and atraumatic. No raccoon eyes, Battle's sign, abrasion or contusion.     Jaw: There is normal jaw occlusion. No trismus.     Right Ear: External ear normal.     Left Ear: External ear normal.     Ears:     Comments: Hearing grossly intact bilaterally    Nose: Nose normal. No rhinorrhea.     Right Nostril: No epistaxis.     Left Nostril: No epistaxis.     Mouth/Throat:     Lips: Pink.     Mouth: Mucous membranes are moist.     Pharynx: Oropharynx is clear. Uvula midline.  Eyes:     General: Vision grossly intact. Gaze aligned appropriately.     Extraocular Movements: Extraocular movements intact.     Conjunctiva/sclera: Conjunctivae normal.     Pupils: Pupils  are equal, round, and reactive to light.     Comments: Visual fields grossly intact bilaterally  Neck:     Musculoskeletal: Full passive range of motion without pain, normal range of motion and neck supple. No neck rigidity.     Trachea: Trachea and phonation normal. No tracheal tenderness or tracheal deviation.     Meningeal: Brudzinski's sign and Kernig's sign absent.  Cardiovascular:     Rate and Rhythm: Normal rate and regular rhythm.     Pulses:          Dorsalis pedis pulses are 2+ on the right side and 2+ on the left side.       Posterior tibial pulses are 2+ on the right side and 2+ on the left side.     Heart sounds: Normal heart sounds.  Pulmonary:     Effort: Pulmonary effort is normal. No respiratory distress.     Breath sounds: Normal breath sounds and air entry.  Abdominal:     General: Bowel sounds are normal. There is no distension.     Palpations: Abdomen is soft.     Tenderness: There is no abdominal tenderness. There is no guarding or rebound.  Musculoskeletal:     Comments: No midline C/T/L spinal tenderness to palpation, no paraspinal muscle tenderness, no deformity, crepitus, or step-off noted. No sign of injury to the neck or back.  Feet:     Right foot:     Protective Sensation: 3 sites tested. 3 sites sensed.     Left foot:     Protective Sensation: 3 sites tested. 3 sites sensed.  Skin:    General: Skin is warm and dry.  Neurological:     Mental Status: He is alert and oriented to person, place, and time.     GCS: GCS eye subscore is 4. GCS verbal subscore is 5. GCS motor subscore is 6.     Comments: Mental Status: Alert, oriented, thought content appropriate, able to give a coherent history. Speech fluent without evidence of aphasia. Able to follow 2 step commands without difficulty. Cranial Nerves: II: Peripheral visual fields grossly normal, pupils equal, round, reactive to light III,IV, VI: ptosis not present, extra-ocular motions intact  bilaterally V,VII: smile symmetric, eyebrows raise symmetric, facial light touch sensation equal VIII: hearing grossly normal to voice X: uvula elevates symmetrically XI: bilateral shoulder shrug symmetric and strong XII: midline tongue extension without fassiculations Motor: Normal tone. 5/5 strength in upper and lower extremities bilaterally including strong and equal grip strength and dorsiflexion/plantar flexion Sensory: Sensation intact to light touch in all extremities.Negative Romberg.  Deep Tendon Reflexes:  2+ and symmetric in patella Cerebellar: normal finger-to-nose maze with bilateral upper extremities. Normal heel-to -shin balance bilaterally of the lower extremity. No pronator drift.  Gait: normal gait and balance CV: distal pulses palpable throughout  Psychiatric:        Mood and Affect: Mood normal.        Behavior: Behavior is cooperative.     ED Treatments / Results  Labs (all labs ordered are listed, but only abnormal results are displayed) Labs Reviewed  BASIC METABOLIC PANEL - Abnormal; Notable for the following components:      Result Value   Glucose, Bld 121 (*)    All other components within normal limits  CBC WITH DIFFERENTIAL/PLATELET    EKG None  Radiology Ct Head Wo Contrast  Result Date: 10/25/2018 CLINICAL DATA:  Headache for 2 weeks with tongue and jaw numbness. EXAM: CT HEAD WITHOUT CONTRAST TECHNIQUE: Contiguous axial images were obtained from the base of the skull through the vertex without intravenous contrast. COMPARISON:  None. FINDINGS: Brain: No evidence of acute infarction, hemorrhage, hydrocephalus, extra-axial collection or mass lesion/mass effect. Vascular: No hyperdense vessel or unexpected calcification. Skull: Normal. Negative for fracture or focal lesion. Sinuses/Orbits: No acute finding. Other: None. IMPRESSION: No acute intracranial abnormalities. Electronically Signed   By: Dorise Bullion III M.D   On: 10/25/2018 13:59     Procedures Procedures (including critical care time)  Medications Ordered in ED Medications  sodium chloride 0.9 % bolus 500 mL ( Intravenous Stopped 10/25/18 1500)  metoCLOPramide (REGLAN) injection 10 mg (10 mg Intravenous Given 10/25/18 1339)  diphenhydrAMINE (BENADRYL) injection 12.5 mg (12.5 mg Intravenous Given 10/25/18 1339)     Initial Impression / Assessment and Plan / ED Course  I have reviewed the triage vital signs and the nursing notes.  Pertinent labs & imaging results that were available during my care of the patient were reviewed by me and considered in my medical decision making (see chart for details).    Anthony Mills is a 58 y.o. male who presents to ED for 2 weeks of headache described as a more severe version of normal headache as well as a tingling sensation intermittent for 1 week of the mouth, tongue and jaw bilaterally.  No visual changes, no claudication, no trismus or tenderness of the face, neck or scalp.  Physical examination reassuring without neuro deficit. No sudden onset. No syncope. No neck stiffness. No trauma. Patient not ill appearing. No meningeal signs. No temporal artery pain No cervical tenderness or facial pain. --------- As patient reports a tingling sensation to his mouth, tongue and jaw will obtain CT head as well as standard blood work, discussed case with Dr. Alvino Chapel who agrees with work-up.  Will treat patient's headache with Reglan, Benadryl and IV fluids and then reassess.  Do not suspect temporal arteritis, no indication for ESR/CRP at this time. - CT Head:    IMPRESSION:  No acute intracranial abnormalities.   CBC within normal limits BMP nonacute - Patient reassessed sleeping, easily arousable to voice.  On reevaluation patient reports great improvement of his headache and he would like to be discharged.  Patient also reports that the tingling sensation around his mouth, jaw and tongue has improved as well.  The patient denies any  neurologic symptoms such as visual changes, focal numbness/weakness, balance problems, confusion, or speech difficulty to suggest a life-threatening intracranial process such as intracranial hemorrhage or mass.  Do not suspect sinus thromboses. No fevers, neck pain or nuchal  rigidity to suggest meningitis. Patient is afebrile, non-toxic and well appearing. Reassuring neuro exam, normal gait around room and back. No cranial deficits, no speech deficits, negative pronator drift, normal/equal strength to all extremities.   Patient already established with a local neurologist, I have encouraged follow-up with them as well as with primary care provider. I have reviewed return precautions including development of fever, nausea/vomiting or neurologic symptoms, vision changes, confusion, lethargy, difficulty speaking/walking, or other new/worsening/concerning symptoms. Patient states understanding of return precautions.  At this time there does not appear to be any evidence of an acute emergency medical condition and the patient appears stable for discharge with appropriate outpatient follow up. Diagnosis was discussed with patient who verbalizes understanding of care plan and is agreeable to discharge. I have discussed return precautions with patient and wife at bedside who verbalizes understanding of return precautions. Patient encouraged to follow-up with their PCP and neurology. All questions answered.  Patient has been discharged in good condition.  Patient's case rediscussed with Dr. Alvino Chapel who agrees with plan to discharge with outpatient neurology follow-up.   Note: Portions of this report may have been transcribed using voice recognition software. Every effort was made to ensure accuracy; however, inadvertent computerized transcription errors may still be present. Final Clinical Impressions(s) / ED Diagnoses   Final diagnoses:  Acute nonintractable headache, unspecified headache type  Facial  tingling    ED Discharge Orders    None       Gari Crown 10/25/18 1547    Davonna Belling, MD 10/28/18 2209

## 2018-10-26 ENCOUNTER — Telehealth: Payer: Self-pay | Admitting: *Deleted

## 2018-10-26 NOTE — Telephone Encounter (Signed)
Pt returning call for covid results; negative. Pt verbalizes understanding. 

## 2018-10-28 ENCOUNTER — Ambulatory Visit: Payer: BLUE CROSS/BLUE SHIELD | Admitting: Gastroenterology

## 2018-10-30 ENCOUNTER — Encounter: Payer: BLUE CROSS/BLUE SHIELD | Admitting: Gastroenterology

## 2018-10-30 ENCOUNTER — Encounter (HOSPITAL_BASED_OUTPATIENT_CLINIC_OR_DEPARTMENT_OTHER): Payer: Self-pay | Admitting: Adult Health

## 2018-10-30 ENCOUNTER — Other Ambulatory Visit: Payer: Self-pay

## 2018-10-30 ENCOUNTER — Emergency Department (HOSPITAL_BASED_OUTPATIENT_CLINIC_OR_DEPARTMENT_OTHER)
Admission: EM | Admit: 2018-10-30 | Discharge: 2018-10-30 | Disposition: A | Discharge status: Home / Self Care | Payer: BC Managed Care – PPO | Attending: Emergency Medicine | Admitting: Emergency Medicine

## 2018-10-30 DIAGNOSIS — R29810 Facial weakness: Secondary | ICD-10-CM | POA: Diagnosis present

## 2018-10-30 DIAGNOSIS — Z79899 Other long term (current) drug therapy: Secondary | ICD-10-CM | POA: Diagnosis not present

## 2018-10-30 MED ORDER — PREDNISONE 20 MG PO TABS: ORAL_TABLET | ORAL | 0 refills | Status: DC

## 2018-10-30 MED ORDER — VALACYCLOVIR HCL 1 G PO TABS: 1000.0000 mg | ORAL_TABLET | Freq: Two times a day (BID) | ORAL | 0 refills | Status: DC

## 2018-10-30 MED ORDER — POLYVINYL ALCOHOL 1.4 % OP SOLN: 1.0000 [drp] | OPHTHALMIC | 0 refills | Status: DC | PRN

## 2018-10-30 NOTE — ED Notes (Signed)
Teleneuro consult called @ 2000

## 2018-10-30 NOTE — ED Provider Notes (Signed)
[facility]PARTMENT Provider Note   CSN: [id number] Arrival date & time: 10/30/18  [id number]     History   Chief Complaint Chief Complaint  Patient presents with   Numbness    HPI Anthony Mills is a 58 y.o. male with past medical history of sarcoidosis and Bell's palsy who presents the emergency department with complaint of headache and jaw numbness.  Patient was seen for the same symptoms On 10/25/2018 and had a CT scan.  He has had now 3 weeks of bilateral jaw numbness worse on the right side.  He was having some tongue numbness and intermittent headaches.  He states that returned because his headaches have not gone away and he continues to have numbness.  He states that his tongue is no longer numb but today around 3 PM he noticed that the right side of his face was drooping and he is afraid that his Bell's palsy has returned.  Patient is also had 6 weeks of tremor in the right hand.  He denies any changes in vision, unilateral weakness, difficulty with speech or swallowing    HPI  Past Medical History:  Diagnosis Date   Allergy    Bell's palsy    Bell's palsy 2018   Hyperlipidemia     Patient Active Problem List   Diagnosis Date Noted   Seasonal allergic rhinitis due to pollen 07/28/2018   Seasonal allergic conjunctivitis 07/28/2018   Bell's palsy     Past Surgical History:  Procedure Laterality Date   CIRCUMCISION     COLONOSCOPY     HERNIA REPAIR     KNEE SURGERY Left    left wrist surgery          Home Medications    Prior to Admission medications   Medication Sig Start Date End Date Taking? Authorizing Provider  rosuvastatin (CRESTOR) 20 MG tablet Take 20 mg by mouth daily.    [provider]  zonisamide (ZONEGRAN) 50 MG capsule Start 1 hs and increase as tolerated to 4 hs 09/11/18   [provider]    Family History Family History  Problem Relation Age of Onset   Asthma Nephew    Colon cancer Neg  Hx    Esophageal cancer Neg Hx    Liver cancer Neg Hx    Pancreatic cancer Neg Hx    Rectal cancer Neg Hx    Stomach cancer Neg Hx    Colon polyps Neg Hx     Social History Social History   Tobacco Use   Smoking status: Never Smoker   Smokeless tobacco: Never Used  Substance Use Topics   Alcohol use: No    Frequency: Never   Drug use: No     Allergies   Codeine   Review of Systems Review of Systems Ten systems reviewed and are negative for acute change, except as noted in the HPI.    Physical Exam Updated Vital Signs BP 107/76    Pulse 92    Temp 98.7 F (37.1 C) (Oral)    Resp 18    Ht 5\' 10"  (1.778 m)    Wt 82 kg    SpO2 97%    BMI 25.94 kg/m   Physical Exam Vitals signs and nursing note reviewed.  Constitutional:      General: He is not in acute distress.    Appearance: He is well-developed. He is not diaphoretic.  HENT:     Head: Normocephalic and atraumatic.  Eyes:  General: No scleral icterus.    Conjunctiva/sclera: Conjunctivae normal.  Neck:     Musculoskeletal: Normal range of motion and neck supple.  Cardiovascular:     Rate and Rhythm: Normal rate and regular rhythm.     Heart sounds: Normal heart sounds.  Pulmonary:     Effort: Pulmonary effort is normal. No respiratory distress.     Breath sounds: Normal breath sounds.  Abdominal:     Palpations: Abdomen is soft.     Tenderness: There is no abdominal tenderness.  Skin:    General: Skin is warm and dry.  Neurological:     Mental Status: He is alert.     Comments: Obvious facial droop on the right.  Weakness with full closing of the right eye.  Able to raise the right eyebrow however there is some flattening of the brow line.  Tongue deviates slightly to the right.  Uvula is midline.  Sensation is equal to light touch on both sides of the face.  Subjective numbness on the right side of the face worse than the left in the jawline.   Patient has an intention tremor in the right  hand.  Strength is equal in the bilateral upper extremities.  Weakness with dorsiflexion of the right ankle.  The remainder of the neurologic examination is within normal.  Psychiatric:        Behavior: Behavior normal.      ED Treatments / Results  Labs (all labs ordered are listed, but only abnormal results are displayed) Labs Reviewed - No data to display  EKG None  Radiology No results found.  Procedures Procedures (including critical care time)  Medications Ordered in ED Medications - No data to display   Initial Impression / Assessment and Plan / ED Course  I have reviewed the triage vital signs and the nursing notes.  Pertinent labs & imaging results that were available during my care of the patient were reviewed by me and considered in my medical decision making (see chart for details).       [id number] numbness and droop and headache VS: BP (!) 124/94    Pulse 70    Temp 98.7 F (37.1 C) (Oral)    Resp 14    Ht 5\' 10"  (1.778 m)    Wt 82 kg    SpO2 100%    BMI 25.94 kg/m   [id number] is gathered by patient  and emr. [id number] considerations for headache include subarachnoid hemorrhage, meningitis, temporal arteritis, glaucoma, cerebral ischemia, carotid/vertebral dissection, intracranial tumor, Venous sinus thrombosis, carbon monoxide poisoning, acute or chronic subdural hemorrhage.  Other considerations include: Migraine, Cluster headache, Hypertension, Caffeine, alcohol, or drug withdrawal, Pseudotumor cerebri, Arteriovenous malformation, Head injury, Neurocysticercosis, Post-lumbar puncture, Preeclampsia, Tension headache, Sinusitis, Cervical arthritis, Refractive error causing strain, Dental abscess, Otitis media, Temporomandibular joint syndrome, Depression, Somatoform disorder (eg, somatization) Trigeminal neuralgia, Glossopharyngeal neuralgia. Labs: Imaging:  EKG: MDM: Patient with abnormal neurologic exam which has been chronic and then acutely changed  around 3 PM today.  Although this appears like a Bell's palsy with some flattening of the facial lines of the right side of his forehead, the right eye blinks slower than the left but he is able to blink fully.  I saw the patient in shared visit with Dr. Rogene Houston.  Patient was also seen with tele-neurology.  All of [place] agree that although this appears to be like a Bell's palsy there are some discrepancy which would further warrant MRI imaging to rule out  stroke.  The patient was advised that he should go to [facility] to continue the work-up however he has declined and does not wish to pursue further imaging tonight.  He has agreed to return to the emergency department tomorrow morning at which time he will check back into the ER to finish the remainder of his work-up to rule out stroke and will need MRI of the brain.  Given the patient's low NIH score I feel that this is somewhat reasonable although best care would be to go to [facility].  I had a long discussion about the risks involved with this which include worsening in condition, worsening neurologic symptoms, or death.  Patient does understand these risks and is competent to make his decision.  The patient appears otherwise reasonably screened at this time. Patient disposition: Discharge Patient condition: Good. The patient appears reasonably screened and/or stabilized for discharge and I doubt any other medical condition or other [facility] requiring further screening, evaluation, or treatment in the ED at this time prior to discharge. I have discussed lab and/or imaging findings with the patient and answered all questions/concerns to the best of my ability. I have discussed return precautions and OP follow up.      Final Clinical Impressions(s) / ED Diagnoses   Final diagnoses:  Facial droop    ED Discharge Orders    None       Margarita Mail, PA-C 10/30/18 2237    Fredia Sorrow, MD 10/30/18 2322

## 2018-10-30 NOTE — ED Triage Notes (Signed)
Anthony Mills presents with bilateral lower face numbness from the nose to the chin and tongue assocaited with a frontal headache that began about three weeks ago. He was seen here on 5 days ago. He reports that symptoms have not gone away. He believes he may have Bell's palsy.

## 2018-10-30 NOTE — ED Notes (Signed)
Right arm shaking slightly, tongue deviation to the right, delayed right eye blink.

## 2018-10-30 NOTE — Consult Note (Addendum)
[facility] Tele[facility]rology Consult Services  Stat Consult  Date of Service:   10/30/2018 20:01:28  Impression:     .  Rule Out Acute Ischemic Stroke  Comments/Sign-Out: The patient most likely has an acute ischemic stroke, vs bells palsy. I suggest an MRI and inpatient workup. No alteplase outside of window. Right sided facial droop - at this time will grab.   Metrics: [facility] Notification Time: 10/30/2018 19:59:56 Stamp Time: 10/30/2018 20:01:28 Callback Response Time: 10/30/2018 20:02:06 Video Start Time: 10/30/2018 20:10:00 Video End Time: 10/30/2018 21:21:17  Our recommendations are outlined below.  Recommendations:     .  Consider Bells Palsey   Imaging Studies:     .  MRI Head with and Without Contrast  Therapies:     .  Physical Therapy, Occupational Therapy, Speech Therapy Assessment When Applicable  Other WorkUp:     .  Infectious/metabolic workup per primary team  Sign Out:     .  Discussed with Emergency Department Provider  ----------------------------------------------------------------------------------------------------  Chief Complaint: bells palsey  History of Present Illness: Patient is a 58 year old Male.  58/M hx bells palsy. Also reports tremors x 6 weeks. Came to ED on 8/30 with tongue/ jaw numbness and headaches. Negative work-up. Around 15:00 today started having R facial droop. On assessment tongue deviated to the right. difficult with dorsiflexion on assessment. No imaging ordered.   Past Medical History:     . Hyperlipidemia  Anticoagulant use:   Antiplatelet use:  Examination: 1A: Level of Consciousness - Alert; keenly responsive + 0 1B: Ask Month and Age - Both Questions Right + 0 1C: Blink Eyes & Squeeze Hands - Performs Both Tasks + 0 2: Test Horizontal Extraocular Movements - Normal + 0 3: Test Visual Fields - No Visual Loss + 0 4: Test Facial Palsy (Use Grimace if Obtunded) - Normal symmetry +  2 5A: Test Left Arm Motor Drift - No Drift for 10 Seconds + 0 5B: Test Right Arm Motor Drift - No Drift for 10 Seconds + 0 6A: Test Left Leg Motor Drift - No Drift for 5 Seconds + 0 6B: Test Right Leg Motor Drift - No Drift for 5 Seconds + 0 7: Test Limb Ataxia (FNF/Heel-Shin) - No Ataxia + 0 8: Test Sensation - Normal; No sensory loss + 1 9: Test Language/Aphasia - Normal; No aphasia + 0 10: Test Dysarthria - Normal + 0 11: Test Extinction/Inattention - No abnormality + 0  NIHSS Score: 3  Due to the immediate potential for life-threatening deterioration due to underlying acute neurologic illness, I spent 35 minutes providing critical care. This time includes time for face to face visit via telemedicine, review of medical records, imaging studies and discussion of findings with providers, the patient and/or family.   Dr Rachael Fee   [facility] [phone]   Case [id number]

## 2018-10-30 NOTE — ED Notes (Signed)
In with patient while Teleneuro MD on for eval.

## 2018-10-30 NOTE — ED Notes (Signed)
ED Provider at bedside. 

## 2018-10-30 NOTE — Discharge Instructions (Addendum)
Return tomorrow to the ER to complete your work up. Get help right away if: You have weakness or numbness in a part of your body other than your face. You have trouble swallowing. You develop neck pain or stiffness. You develop dizziness or shortness of breath.

## 2018-10-31 ENCOUNTER — Emergency Department (HOSPITAL_BASED_OUTPATIENT_CLINIC_OR_DEPARTMENT_OTHER): Payer: BC Managed Care – PPO

## 2018-10-31 ENCOUNTER — Emergency Department (HOSPITAL_BASED_OUTPATIENT_CLINIC_OR_DEPARTMENT_OTHER)
Admission: EM | Admit: 2018-10-31 | Discharge: 2018-10-31 | Disposition: A | Discharge status: Home / Self Care | Payer: BC Managed Care – PPO | Attending: Emergency Medicine | Admitting: Emergency Medicine

## 2018-10-31 ENCOUNTER — Encounter (HOSPITAL_BASED_OUTPATIENT_CLINIC_OR_DEPARTMENT_OTHER): Payer: Self-pay | Admitting: Emergency Medicine

## 2018-10-31 DIAGNOSIS — G510 Bell's palsy: Secondary | ICD-10-CM | POA: Insufficient documentation

## 2018-10-31 DIAGNOSIS — E785 Hyperlipidemia, unspecified: Secondary | ICD-10-CM | POA: Insufficient documentation

## 2018-10-31 DIAGNOSIS — R29810 Facial weakness: Secondary | ICD-10-CM | POA: Diagnosis present

## 2018-10-31 MED ORDER — GADOBUTROL 1 MMOL/ML IV SOLN
8.0000 mL | Freq: Once | INTRAVENOUS | Status: AC | PRN
  Administered 2018-10-31: 11:00:00 8 mL via INTRAVENOUS

## 2018-10-31 MED ORDER — POLYVINYL ALCOHOL 1.4 % OP SOLN: 1.0000 [drp] | OPHTHALMIC | 0 refills | Status: DC | PRN

## 2018-10-31 MED ORDER — VALACYCLOVIR HCL 1 G PO TABS: 1000.0000 mg | ORAL_TABLET | Freq: Two times a day (BID) | ORAL | 0 refills | Status: DC

## 2018-10-31 MED ORDER — PREDNISONE 20 MG PO TABS: ORAL_TABLET | ORAL | 0 refills | Status: DC

## 2018-10-31 NOTE — ED Provider Notes (Signed)
[facility] EM[facility] DEPARTMENT Provider Note   CSN: [id number] Arrival date & time: 10/31/18  0827     History   Chief Complaint Chief Complaint  Patient presents with  . Needs MRI    HPI Anthony Mills is a 58 y.o. male.     HPI Patient developed right-sided facial droop yesterday.  Has had previous episodes of Bell's palsy.  Was seen in the emergency department and neurology was consulted.  Recommended MRI which would require transfer.  Patient opted to return today for MRI.  No change in symptoms.  No extremity weakness or numbness. Past Medical History:  Diagnosis Date  . Allergy   . Bell's palsy   . Bell's palsy 2018  . Hyperlipidemia     Patient Active Problem List   Diagnosis Date Noted  . Seasonal allergic rhinitis due to pollen 07/28/2018  . Seasonal allergic conjunctivitis 07/28/2018  . Bell's palsy     Past Surgical History:  Procedure Laterality Date  . CIRCUMCISION    . COLONOSCOPY    . HERNIA REPAIR    . KNEE SURGERY Left   . left wrist surgery          Home Medications    Prior to Admission medications   Medication Sig Start Date End Date Taking? Authorizing Provider  polyvinyl alcohol (LIQUIFILM TEARS) 1.4 % ophthalmic solution Place 1 drop into the right eye as needed for dry eyes. 10/31/18   Julianne Rice, MD  predniSONE (DELTASONE) 20 MG tablet 3 tabs po daily x 3 days, then 2 tabs x 3 days, then 1.5 tabs x 3 days, then 1 tab x 3 days, then 0.5 tabs x 3 days 10/31/18   Julianne Rice, MD  rosuvastatin (CRESTOR) 20 MG tablet Take 20 mg by mouth daily.    [provider]  valACYclovir (VALTREX) 1000 MG tablet Take 1 tablet (1,000 mg total) by mouth 2 (two) times daily. 10/31/18   Julianne Rice, MD  zonisamide (ZONEGRAN) 50 MG capsule Start 1 hs and increase as tolerated to 4 hs 09/11/18   [provider]    Family History Family History  Problem Relation Age of Onset  . Asthma Nephew   . Colon cancer Neg  Hx   . Esophageal cancer Neg Hx   . Liver cancer Neg Hx   . Pancreatic cancer Neg Hx   . Rectal cancer Neg Hx   . Stomach cancer Neg Hx   . Colon polyps Neg Hx     Social History Social History   Tobacco Use  . Smoking status: Never Smoker  . Smokeless tobacco: Never Used  Substance Use Topics  . Alcohol use: No    Frequency: Never  . Drug use: No     Allergies   Codeine   Review of Systems Review of Systems  Constitutional: Negative for chills and fever.  HENT: Negative for facial swelling and sore throat.   Eyes: Negative for visual disturbance.  Gastrointestinal: Negative for abdominal pain, nausea and vomiting.  Musculoskeletal: Negative for back pain and neck pain.  Skin: Negative for rash and wound.  Neurological: Positive for facial asymmetry. Negative for dizziness and headaches.  All other systems reviewed and are negative.    Physical Exam Updated Vital Signs BP 122/88   Pulse 69   Temp 98.1 F (36.7 C) (Oral)   Resp 18   SpO2 99%   Physical Exam Vitals signs and nursing note reviewed.  Constitutional:  Appearance: Normal appearance. He is well-developed.  HENT:     Head: Normocephalic and atraumatic.     Comments: Right upper and lower facial weakness compared to left.    Mouth/Throat:     Mouth: Mucous membranes are moist.  Eyes:     Pupils: Pupils are equal, round, and reactive to light.  Neck:     Musculoskeletal: Normal range of motion and neck supple. No neck rigidity or muscular tenderness.  Cardiovascular:     Rate and Rhythm: Normal rate and regular rhythm.     Heart sounds: No murmur. No friction rub. No gallop.   Pulmonary:     Effort: Pulmonary effort is normal.     Breath sounds: Normal breath sounds.  Abdominal:     General: Bowel sounds are normal.     Palpations: Abdomen is soft.     Tenderness: There is no abdominal tenderness. There is no guarding or rebound.  Musculoskeletal: Normal range of motion.         General: No tenderness.  Lymphadenopathy:     Cervical: No cervical adenopathy.  Skin:    General: Skin is warm and dry.     Findings: No erythema or rash.  Neurological:     Mental Status: He is alert and oriented to person, place, and time.     Comments: 5/5 motor in bilateral upper and lower extremities.  Sensation intact.  Psychiatric:        Behavior: Behavior normal.      ED Treatments / Results  Labs (all labs ordered are listed, but only abnormal results are displayed) Labs Reviewed - No data to display  EKG None  Radiology Mr Jeri Cos And Wo Contrast  Result Date: 10/31/2018 CLINICAL DATA:  Left facial droop for 2 days with left eye pain and dryness. EXAM: MRI HEAD WITHOUT AND WITH CONTRAST TECHNIQUE: Multiplanar, multiecho pulse sequences of the brain and surrounding structures were obtained without and with intravenous contrast. CONTRAST:  8 cc Gadavist intravenous COMPARISON:  04/11/2017 FINDINGS: Brain: No infarction, hemorrhage, hydrocephalus, extra-axial collection or mass lesion. No abnormal enhancement in the temporal bones or along the facial nerves. Numerous FLAIR hyperintensities randomly distributed in the cerebral white matter without interval progression, restricted diffusion, or enhancement. These could be from prior ischemia, inflammation, or infection (including Lyme disease in this patient with history of Bell's palsy). Vascular: Major flow voids and vascular enhancements are preserved. Skull and upper cervical spine: Negative Sinuses/Orbits: Negative IMPRESSION: 1. No acute or reversible finding. 2. 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 Electronically Signed   By: Monte Fantasia M.D.   On: 10/31/2018 10:48    Procedures Procedures (including critical care time)  Medications Ordered in ED Medications  gadobutrol (GADAVIST) 1 MMOL/ML injection 8 mL (8 mLs Intravenous Contrast Given 10/31/18 1031)     Initial  Impression / Assessment and Plan / ED Course  I have reviewed the triage vital signs and the nursing notes.  Pertinent labs & imaging results that were available during my care of the patient were reviewed by me and considered in my medical decision making (see chart for details).        MRI without acute findings.  Patient requesting to send in his prescriptions from yesterday to the pharmacy.  He has appointment follow-up with his neurologist next week.  Return precautions given.  Final Clinical Impressions(s) / ED Diagnoses   Final diagnoses:  Right-sided Bell's palsy    ED Discharge Orders  Ordered    polyvinyl alcohol (LIQUIFILM TEARS) 1.4 % ophthalmic solution  As needed     10/31/18 1126    predniSONE (DELTASONE) 20 MG tablet     10/31/18 1126    valACYclovir (VALTREX) 1000 MG tablet  2 times daily     10/31/18 1126           Julianne Rice, MD 10/31/18 1130

## 2018-10-31 NOTE — ED Triage Notes (Signed)
Pt here after refusing MRI yesterday. Would like MRI and medications he received yesterday to be called into the pharmacy.

## 2018-11-03 ENCOUNTER — Telehealth: Payer: Self-pay | Admitting: Gastroenterology

## 2018-11-03 NOTE — Telephone Encounter (Signed)
You can book him directly in a banding spot and I will discuss everything with him at the time and can perform the banding then. Thanks

## 2018-11-13 DIAGNOSIS — N401 Enlarged prostate with lower urinary tract symptoms: Secondary | ICD-10-CM | POA: Insufficient documentation

## 2018-11-13 DIAGNOSIS — E291 Testicular hypofunction: Secondary | ICD-10-CM | POA: Insufficient documentation

## 2018-12-01 ENCOUNTER — Encounter: Payer: BC Managed Care – PPO | Admitting: Gastroenterology

## 2018-12-04 ENCOUNTER — Ambulatory Visit (INDEPENDENT_AMBULATORY_CARE_PROVIDER_SITE_OTHER): Payer: BC Managed Care – PPO | Admitting: Gastroenterology

## 2018-12-04 ENCOUNTER — Encounter: Payer: Self-pay | Admitting: Gastroenterology

## 2018-12-04 VITALS — BP 100/60 | HR 73 | Temp 98.3°F | Ht 71.0 in | Wt 184.0 lb

## 2018-12-04 DIAGNOSIS — K641 Second degree hemorrhoids: Secondary | ICD-10-CM

## 2018-12-04 NOTE — Patient Instructions (Signed)

## 2018-12-04 NOTE — Progress Notes (Signed)
58 year old male here for follow-up visit.  I performed his colonoscopy in January at which point time he had a fair prep and internal hemorrhoids noted.  He is here requesting hemorrhoid banding today for symptomatic hemorrhoids.  He has had intermittent bleeding that bothers him about 20% of the time, as well as some irritation.  He does have some baseline straining and mild constipation which bothers him.  He has been using MiraLAX but states sometimes he does not like how it makes him feel.  He is been using Preparation H.  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  We discussed options and he wants to proceed with banding today.  Anoscopy performed and shows moderate sized internal hemorrhoids, largest in RP position.   Colonoscopy 03/13/18 - Preparation of the colon was fair. - Internal hemorrhoids. - The examination was otherwise normal. Overall no large polyps or mass lesions, however small or very flat lesions may not have been appreciated given preparation.  PROCEDURE NOTE: The patient presents with symptomatic grade II  hemorrhoids, requesting rubber band ligation of his/her hemorrhoidal disease.  All risks, benefits and alternative forms of therapy were described and informed consent was obtained.  The anorectum was pre-medicated with 0.125% nitroglycerin The decision was made to band the RP internal hemorrhoid, and the [place] Hotrontk was used to perform band ligation without complication.  Digital anorectal examination was then performed to assure proper positioning of the band, and to adjust the banded tissue as required.  The patient was discharged home without pain or other issues.  Dietary and behavioral recommendations were given and along with follow-up instructions.     The following adjunctive treatments were recommended: Citrucel daily to treat constipation  The patient will return in 2-4 weeks for  follow-up and possible additional banding as required. No  complications were encountered and the patient tolerated the procedure well.  Banks Cellar, MD [facility]

## 2018-12-23 ENCOUNTER — Telehealth: Payer: Self-pay | Admitting: Gastroenterology

## 2018-12-23 ENCOUNTER — Telehealth: Payer: Self-pay

## 2018-12-23 NOTE — Telephone Encounter (Signed)
Patient called and needed to cancel his appt. 12/25/18 for a 2nd Hemorrhoid banding because he can't get off work. He already has a banding scheduled for 01/15/19 which he can keep

## 2018-12-25 ENCOUNTER — Encounter: Payer: BC Managed Care – PPO | Admitting: Gastroenterology

## 2019-01-12 ENCOUNTER — Encounter: Payer: BC Managed Care – PPO | Admitting: Gastroenterology

## 2019-01-15 ENCOUNTER — Ambulatory Visit (INDEPENDENT_AMBULATORY_CARE_PROVIDER_SITE_OTHER): Payer: BC Managed Care – PPO | Admitting: Gastroenterology

## 2019-01-15 ENCOUNTER — Encounter: Payer: Self-pay | Admitting: Gastroenterology

## 2019-01-15 VITALS — BP 96/66 | HR 76 | Temp 97.4°F | Ht 70.0 in | Wt 181.8 lb

## 2019-01-15 DIAGNOSIS — K641 Second degree hemorrhoids: Secondary | ICD-10-CM | POA: Diagnosis not present

## 2019-01-15 NOTE — Patient Instructions (Signed)
If you are age 58 or older, your body mass index should be between 23-30. Your Body mass index is 26.09 kg/m. If this is out of the aforementioned range listed, please consider follow up with your Primary Care Provider.  If you are age 68 or younger, your body mass index should be between 19-25. Your Body mass index is 26.09 kg/m. If this is out of the aformentioned range listed, please consider follow up with your Primary Care Provider.   HEMORRHOID BANDING PROCEDURE    FOLLOW-UP CARE   1. The procedure you have had should have been relatively painless since the banding of the area involved does not have nerve endings and there is no pain sensation.  The rubber band cuts off the blood supply to the hemorrhoid and the band may fall off as soon as 48 hours after the banding (the band may occasionally be seen in the toilet bowl following a bowel movement). You may notice a temporary feeling of fullness in the rectum which should respond adequately to plain Tylenol or Motrin.  2. Following the banding, avoid strenuous exercise that evening and resume full activity the next day.  A sitz bath (soaking in a warm tub) or bidet is soothing, and can be useful for cleansing the area after bowel movements.     3. To avoid constipation, take two tablespoons of natural wheat bran, natural oat bran, flax, Benefiber or any over the counter fiber supplement and increase your water intake to 7-8 glasses daily.    4. Unless you have been prescribed anorectal medication, do not put anything inside your rectum for two weeks: No suppositories, enemas, fingers, etc.  5. Occasionally, you may have more bleeding than usual after the banding procedure.  This is often from the untreated hemorrhoids rather than the treated one.  Don't be concerned if there is a tablespoon or so of blood.  If there is more blood than this, lie flat with your bottom higher than your head and apply an ice pack to the area. If the bleeding  does not stop within a half an hour or if you feel faint, call our office at [phone] or go to the emergency room.  6. Problems are not common; however, if there is a substantial amount of bleeding, severe pain, chills, fever or difficulty passing urine (very rare) or other problems, you should call us at [phone] or report to the nearest emergency room.  7. Do not stay seated continuously for more than 2-3 hours for a day or two after the procedure.  Tighten your buttock muscles 10-15 times every two hours and take 10-15 deep breaths every 1-2 hours.  Do not spend more than a few minutes on the toilet if you cannot empty your bowel; instead re-visit the toilet at a later time.    Thank you for entrusting me with your care and for choosing [facility], Dr. Harvey Cellar

## 2019-01-15 NOTE — Progress Notes (Signed)
58 year old male here for hemorrhoid banding.  I performed his colonoscopy in January at which point time he had a fair prep and internal hemorrhoids noted.  He had bleeding from hemorrhoids and irritation chronically. He elected to proceed with hemorrhoid banding. RP banded on 12/04/18. He states he had a great result, symptoms are much improved and he is quite happy with the progress. Wanted to proceed with additional therapy today.     Colonoscopy 03/13/18 - Preparation of the colon was fair. - Internal hemorrhoids. - The examination was otherwise normal. Overall no large polyps or mass lesions, however small or very flat lesions may not have been appreciated given preparation. Repeat exam within a year.    PROCEDURE NOTE: The patient presents with symptomatic grade II  hemorrhoids, requesting rubber band ligation of his/her hemorrhoidal disease.  All risks, benefits and alternative forms of therapy were described and informed consent was obtained.   The anorectum was pre-medicated with 0.125% nitroglycerin The decision was made to band the LL internal hemorrhoid, and the Payette was used to perform band ligation without complication.  Digital anorectal examination was then performed to assure proper positioning of the band, and to adjust the banded tissue as required.  The patient was discharged home without pain or other issues.  Dietary and behavioral recommendations were given and along with follow-up instructions.     The following adjunctive treatments were recommended: Citrucel once daily  The patient will return in 2-4 weeks for  follow-up and possible additional banding as required. No complications were encountered and the patient tolerated the procedure well. Would plan on repeat colonoscopy one year from his last exam given fair prep noted previously .   Limestone Cellar, MD [facility]

## 2019-02-08 ENCOUNTER — Other Ambulatory Visit: Payer: Self-pay

## 2019-02-08 ENCOUNTER — Encounter: Payer: Self-pay | Admitting: Gastroenterology

## 2019-02-08 ENCOUNTER — Ambulatory Visit (INDEPENDENT_AMBULATORY_CARE_PROVIDER_SITE_OTHER): Payer: BC Managed Care – PPO | Admitting: Gastroenterology

## 2019-02-08 ENCOUNTER — Encounter: Payer: BC Managed Care – PPO | Admitting: Gastroenterology

## 2019-02-08 VITALS — BP 110/66 | HR 78 | Temp 97.4°F | Ht 71.0 in | Wt 183.0 lb

## 2019-02-08 DIAGNOSIS — K641 Second degree hemorrhoids: Secondary | ICD-10-CM

## 2019-02-08 DIAGNOSIS — Z1211 Encounter for screening for malignant neoplasm of colon: Secondary | ICD-10-CM

## 2019-02-08 NOTE — Progress Notes (Signed)
58 year old male here for follow-up visit for banding. He's had 2 hemorrhoid bandings thus far - RP and LL - has had a great response to banding so far and wishes to proceed with final banding today. Initially had symptoms of irritation and bleeding, not bothering him much anymore. Of note, last colonoscopy one year ago limited by prep, he is agreeable to do another colonoscopy in January.   Colonoscopy 03/13/18 - Preparation of the colon was fair. - Internal hemorrhoids. - The examination was otherwise normal. Overall no large polyps or mass lesions, however small or very flat lesions may not have been appreciated given preparation.   PROCEDURE NOTE: The patient presents with symptomatic grade II  hemorrhoids, requesting rubber band ligation of his/her hemorrhoidal disease. All risks, benefits and alternative forms of therapy were described and informed consent was obtained.  The anorectum was pre-medicated with 0.125% nitroglycerin The decision was made to band the RA internal hemorrhoid, and the Hillman was used to perform band ligation without complication. Digital anorectal examination was then performed to assure proper positioning of the band, and to adjust the banded tissue as required.  The patient was discharged home without pain or other issues. Dietary and behavioral recommendations were given and along with follow-up instructions.    The following adjunctive treatments were recommended: Citrucel daily to treat constipation Will plan on colonoscopy in January - double prep - given fair prep on his last exam.  The patient will return as needed for follow up. No complications were encountered and the patient tolerated the procedure well.  Parmer Cellar, MD [facility]

## 2019-02-08 NOTE — Patient Instructions (Signed)

## 2019-02-10 ENCOUNTER — Telehealth: Payer: Self-pay | Admitting: Gastroenterology

## 2019-02-22 DIAGNOSIS — R7303 Prediabetes: Secondary | ICD-10-CM | POA: Insufficient documentation

## 2019-02-22 DIAGNOSIS — E1165 Type 2 diabetes mellitus with hyperglycemia: Secondary | ICD-10-CM | POA: Insufficient documentation

## 2019-02-22 DIAGNOSIS — E119 Type 2 diabetes mellitus without complications: Secondary | ICD-10-CM | POA: Insufficient documentation

## 2019-03-24 ENCOUNTER — Telehealth: Payer: Self-pay

## 2019-03-24 NOTE — Telephone Encounter (Signed)
Called pt in regards to COVID-19 test. I noticed there was not a covid test scheduled for him before his procedure on 1/29. Was not able to reach patient. Left a voicemail to have pt call office to schedule covid test before his procedure.

## 2019-03-25 ENCOUNTER — Telehealth: Payer: Self-pay | Admitting: *Deleted

## 2019-03-25 NOTE — Telephone Encounter (Signed)
Attempted to reach patient again regarding Covid testing. Patient did not answer. Left a message for patient to not start bowel prep this evening as his procedure will have to be cancelled for tomorrow at 8am due to not having completed Covid testing. Instructed patient to call back at [phone] to reschedule his procedure if he still wanted to have it done.

## 2019-03-26 ENCOUNTER — Telehealth: Payer: Self-pay

## 2019-03-26 ENCOUNTER — Encounter: Payer: BC Managed Care – PPO | Admitting: Gastroenterology

## 2019-03-26 NOTE — Telephone Encounter (Signed)
Sorry to hear this. I'm not really sure which encounter he is speaking about with the front desk, this is the first I am hearing of this. I have discussed with our office manager who will contact the patient and front desk and try to figure out what happened. I am happy to take care of him if he wishes to continue to receive care here.

## 2019-03-26 NOTE — Telephone Encounter (Signed)
Pt called office today in regards to appointment that was scheduled for 0800 with Dr. Havery Moros. Pt stated that because he had a bad experience with front desk staff on 3rd floor, that he would not be returning to our facility. He stated that front desk staff was unprofessional via phone, and also when he arrived at his procedure on the 3rd floor on Feb 08, 2019. He described staff member as "the lady that sits right in front the elevator when you first get off". He stated that she gave him a hard time on the phone before appt, and upon arrival he stated that after he told her his name her was response was "oh its you". Pt thanked Dr. Havery Moros for "being great to him as his doctor" and thanked the staff in the back for "being wonderful", but stated he would not be returning to our facility.

## 2019-05-03 NOTE — Telephone Encounter (Signed)
error 

## 2019-10-31 IMAGING — CT CT HEAD WITHOUT CONTRAST
3 series · 16 of 47 positions shown, 19 images · non-contrast
Comparison: None.

CLINICAL DATA: Headache for 2 weeks with tongue and jaw numbness.

EXAM:
CT HEAD WITHOUT CONTRAST
TECHNIQUE: Contiguous axial images were obtained from the base of the skull
through the vertex without intravenous contrast.

[Series 2: head wo · axial · 0.48mm/px · z∈[-167,-17]mm · 10 of 36 slices shown, 13 images]
[im 3/36  brain]
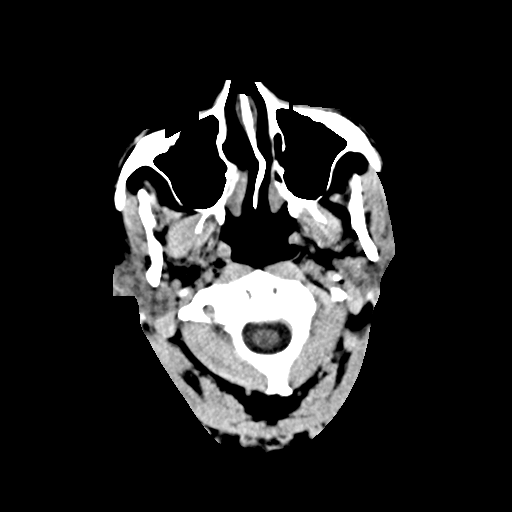
[im 3/36  bone]
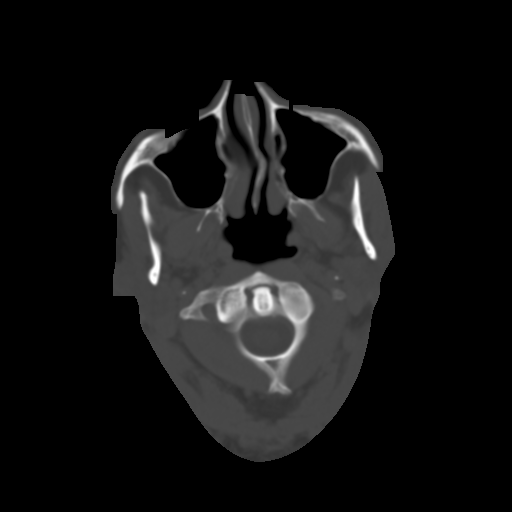
[im 7/36  brain]
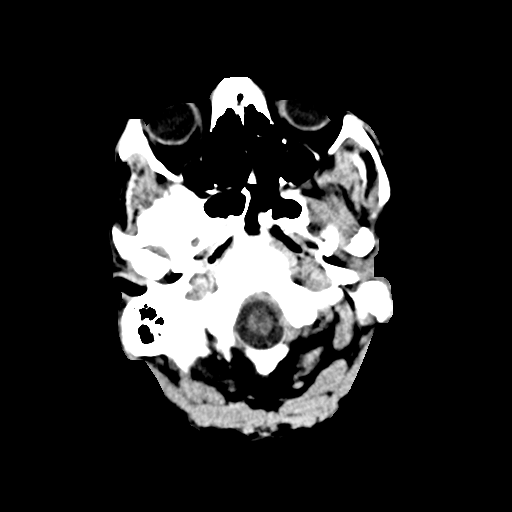
[im 10/36  brain]
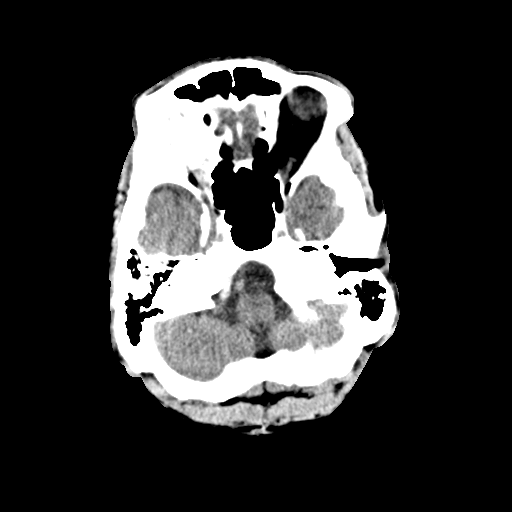
[im 13/36  brain]
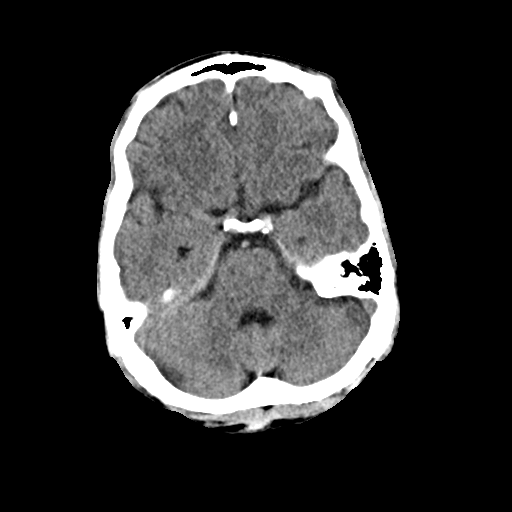
[im 16/36  brain]
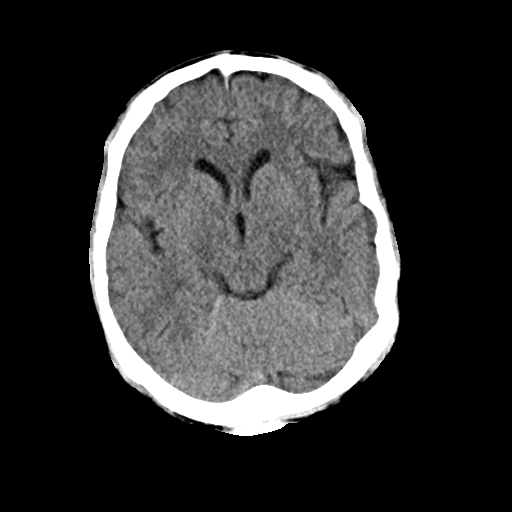
[im 16/36  bone]
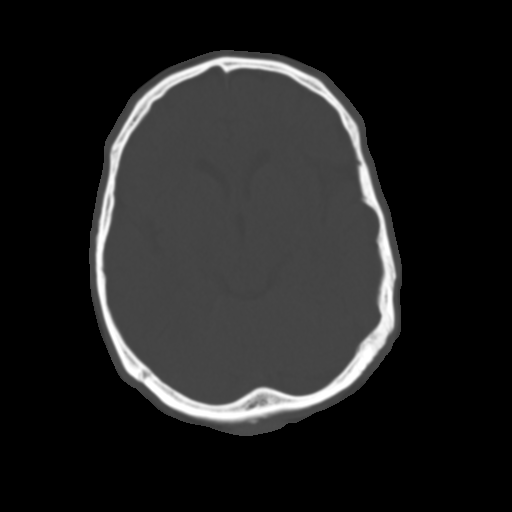
[im 20/36  brain]
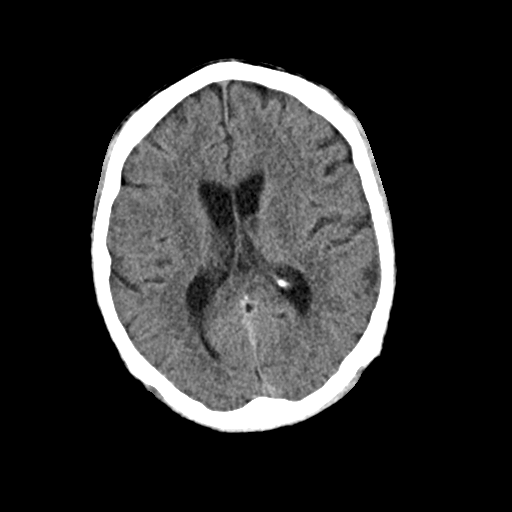
[im 23/36  brain]
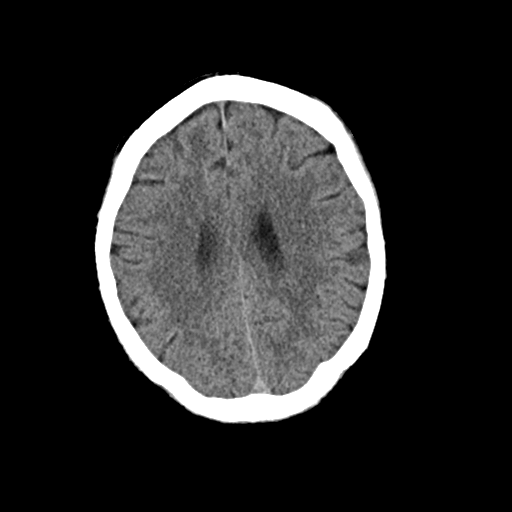
[im 27/36  brain]
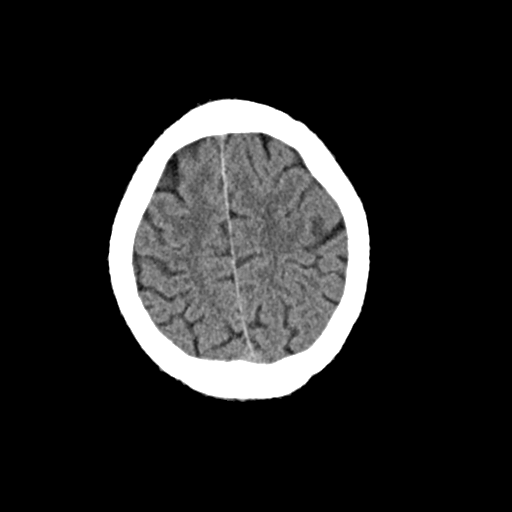
[im 29/36  brain]
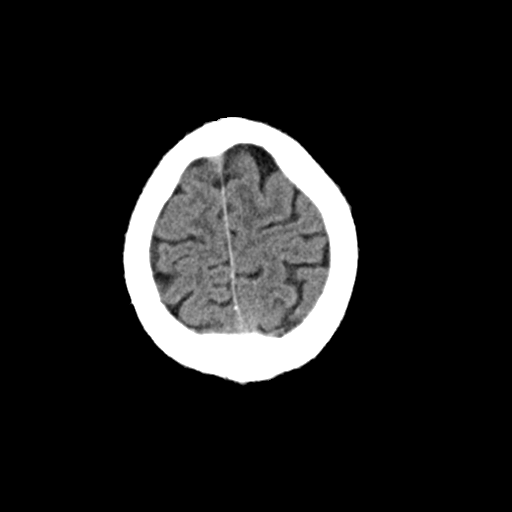
[im 29/36  bone]
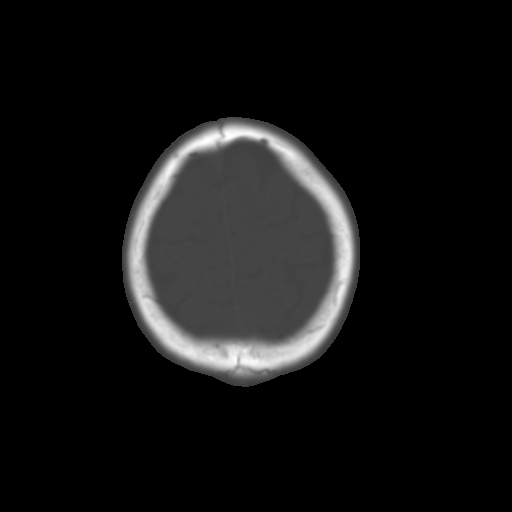
[im 33/36  brain]
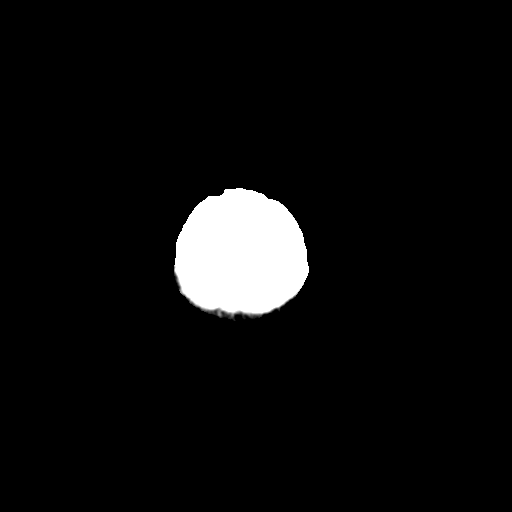

[Series 4: coronal soft · coronal · 0.42mm/px · 3 of 67 slices shown]
[im 23/67  brain]
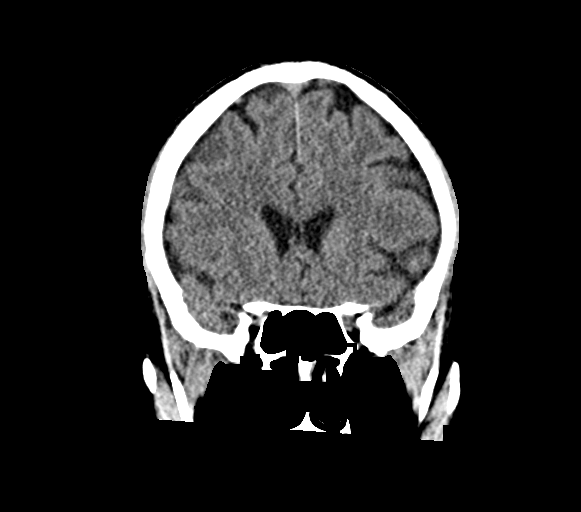
[im 30/67  brain]
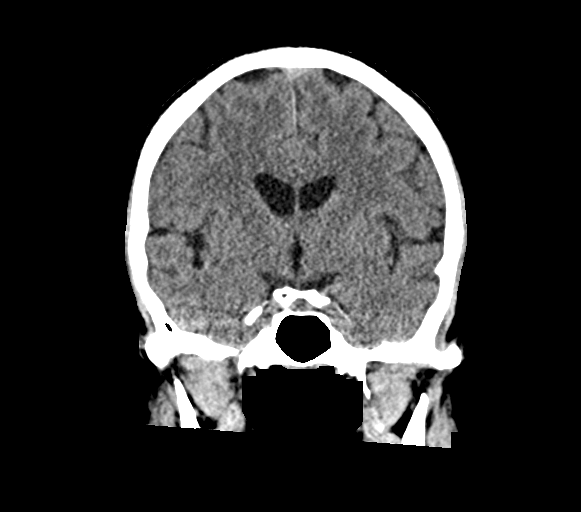
[im 37/67  brain]
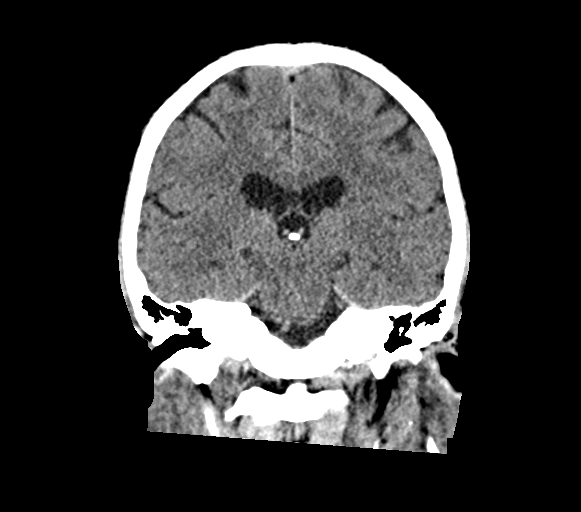

[Series 5: sag soft · sagittal · 0.39mm/px · 3 of 82 slices shown]
[im 28/82  brain]
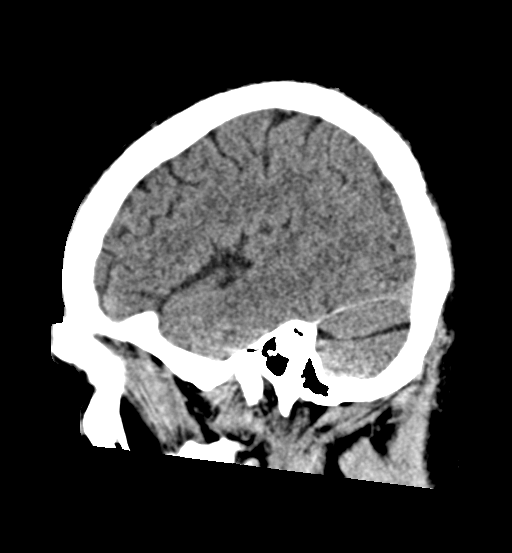
[im 41/82  brain]
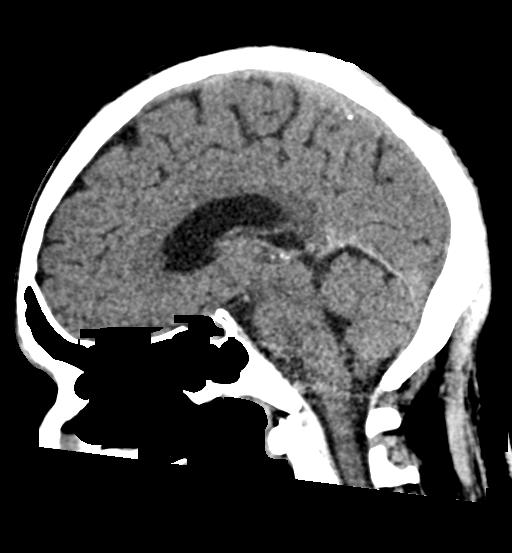
[im 55/82  brain]
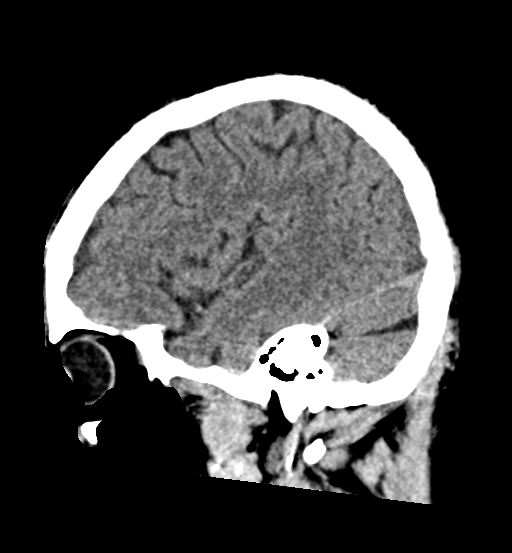

[16 of 47 positions shown; findings below may reference images not displayed]

FINDINGS: Brain: No evidence of acute infarction, hemorrhage, hydrocephalus,
extra-axial collection or mass lesion/mass effect.

Vascular: No hyperdense vessel or unexpected calcification.

Skull: Normal. Negative for fracture or focal lesion.

Sinuses/Orbits: No acute finding.

Other: None.
IMPRESSION: No acute intracranial abnormalities.

## 2019-11-02 DIAGNOSIS — E538 Deficiency of other specified B group vitamins: Secondary | ICD-10-CM | POA: Insufficient documentation

## 2020-05-12 DIAGNOSIS — H35063 Retinal vasculitis, bilateral: Secondary | ICD-10-CM | POA: Diagnosis not present

## 2020-05-12 DIAGNOSIS — H2513 Age-related nuclear cataract, bilateral: Secondary | ICD-10-CM | POA: Diagnosis not present

## 2020-05-12 DIAGNOSIS — D8683 Sarcoid iridocyclitis: Secondary | ICD-10-CM | POA: Diagnosis not present

## 2020-05-12 DIAGNOSIS — H30033 Focal chorioretinal inflammation, peripheral, bilateral: Secondary | ICD-10-CM | POA: Diagnosis not present

## 2020-05-12 DIAGNOSIS — Z79899 Other long term (current) drug therapy: Secondary | ICD-10-CM | POA: Diagnosis not present

## 2020-05-19 DIAGNOSIS — D8683 Sarcoid iridocyclitis: Secondary | ICD-10-CM | POA: Diagnosis not present

## 2020-05-19 DIAGNOSIS — Z23 Encounter for immunization: Secondary | ICD-10-CM | POA: Diagnosis not present

## 2020-05-19 DIAGNOSIS — D869 Sarcoidosis, unspecified: Secondary | ICD-10-CM | POA: Diagnosis not present

## 2020-05-25 DIAGNOSIS — H3023 Posterior cyclitis, bilateral: Secondary | ICD-10-CM | POA: Diagnosis not present

## 2020-05-25 DIAGNOSIS — R0600 Dyspnea, unspecified: Secondary | ICD-10-CM | POA: Diagnosis not present

## 2020-05-25 DIAGNOSIS — D869 Sarcoidosis, unspecified: Secondary | ICD-10-CM | POA: Diagnosis not present

## 2020-06-23 DIAGNOSIS — D869 Sarcoidosis, unspecified: Secondary | ICD-10-CM | POA: Diagnosis not present

## 2020-08-11 DIAGNOSIS — H30033 Focal chorioretinal inflammation, peripheral, bilateral: Secondary | ICD-10-CM | POA: Diagnosis not present

## 2020-08-11 DIAGNOSIS — H3581 Retinal edema: Secondary | ICD-10-CM | POA: Diagnosis not present

## 2020-08-11 DIAGNOSIS — H2513 Age-related nuclear cataract, bilateral: Secondary | ICD-10-CM | POA: Diagnosis not present

## 2020-08-11 DIAGNOSIS — Z79899 Other long term (current) drug therapy: Secondary | ICD-10-CM | POA: Diagnosis not present

## 2020-08-11 DIAGNOSIS — D869 Sarcoidosis, unspecified: Secondary | ICD-10-CM | POA: Diagnosis not present

## 2020-08-18 DIAGNOSIS — J302 Other seasonal allergic rhinitis: Secondary | ICD-10-CM | POA: Diagnosis not present

## 2020-08-18 DIAGNOSIS — E538 Deficiency of other specified B group vitamins: Secondary | ICD-10-CM | POA: Diagnosis not present

## 2020-08-18 DIAGNOSIS — R7303 Prediabetes: Secondary | ICD-10-CM | POA: Diagnosis not present

## 2020-08-18 DIAGNOSIS — E7841 Elevated Lipoprotein(a): Secondary | ICD-10-CM | POA: Diagnosis not present

## 2020-10-27 DIAGNOSIS — G5601 Carpal tunnel syndrome, right upper limb: Secondary | ICD-10-CM | POA: Diagnosis not present

## 2020-10-27 DIAGNOSIS — Z23 Encounter for immunization: Secondary | ICD-10-CM | POA: Diagnosis not present

## 2020-10-27 DIAGNOSIS — R200 Anesthesia of skin: Secondary | ICD-10-CM | POA: Diagnosis not present

## 2020-12-22 DIAGNOSIS — H30033 Focal chorioretinal inflammation, peripheral, bilateral: Secondary | ICD-10-CM | POA: Diagnosis not present

## 2020-12-22 DIAGNOSIS — Z79899 Other long term (current) drug therapy: Secondary | ICD-10-CM | POA: Diagnosis not present

## 2020-12-22 DIAGNOSIS — H2513 Age-related nuclear cataract, bilateral: Secondary | ICD-10-CM | POA: Diagnosis not present

## 2020-12-22 DIAGNOSIS — H3581 Retinal edema: Secondary | ICD-10-CM | POA: Diagnosis not present

## 2020-12-22 DIAGNOSIS — D869 Sarcoidosis, unspecified: Secondary | ICD-10-CM | POA: Diagnosis not present

## 2021-02-01 DIAGNOSIS — M4722 Other spondylosis with radiculopathy, cervical region: Secondary | ICD-10-CM | POA: Diagnosis not present

## 2021-02-01 DIAGNOSIS — M50122 Cervical disc disorder at C5-C6 level with radiculopathy: Secondary | ICD-10-CM | POA: Diagnosis not present

## 2021-02-01 DIAGNOSIS — M4312 Spondylolisthesis, cervical region: Secondary | ICD-10-CM | POA: Diagnosis not present

## 2021-02-01 DIAGNOSIS — M5412 Radiculopathy, cervical region: Secondary | ICD-10-CM | POA: Diagnosis not present

## 2021-02-09 DIAGNOSIS — M5412 Radiculopathy, cervical region: Secondary | ICD-10-CM | POA: Diagnosis not present

## 2021-02-09 DIAGNOSIS — E7841 Elevated Lipoprotein(a): Secondary | ICD-10-CM | POA: Diagnosis not present

## 2021-02-09 DIAGNOSIS — Z0000 Encounter for general adult medical examination without abnormal findings: Secondary | ICD-10-CM | POA: Diagnosis not present

## 2021-02-09 DIAGNOSIS — R7303 Prediabetes: Secondary | ICD-10-CM | POA: Diagnosis not present

## 2021-02-09 DIAGNOSIS — Z853 Personal history of malignant neoplasm of breast: Secondary | ICD-10-CM | POA: Diagnosis not present

## 2021-02-09 DIAGNOSIS — Z125 Encounter for screening for malignant neoplasm of prostate: Secondary | ICD-10-CM | POA: Diagnosis not present

## 2021-02-09 DIAGNOSIS — Z8546 Personal history of malignant neoplasm of prostate: Secondary | ICD-10-CM | POA: Diagnosis not present

## 2021-03-09 DIAGNOSIS — D170 Benign lipomatous neoplasm of skin and subcutaneous tissue of head, face and neck: Secondary | ICD-10-CM | POA: Diagnosis not present

## 2021-03-23 ENCOUNTER — Other Ambulatory Visit: Payer: Self-pay

## 2021-03-23 ENCOUNTER — Emergency Department (HOSPITAL_BASED_OUTPATIENT_CLINIC_OR_DEPARTMENT_OTHER)
Admission: EM | Admit: 2021-03-23 | Discharge: 2021-03-23 | Disposition: A | Discharge status: Home / Self Care | Payer: BC Managed Care – PPO | Attending: Emergency Medicine | Admitting: Emergency Medicine

## 2021-03-23 ENCOUNTER — Encounter (HOSPITAL_BASED_OUTPATIENT_CLINIC_OR_DEPARTMENT_OTHER): Payer: Self-pay

## 2021-03-23 DIAGNOSIS — M25511 Pain in right shoulder: Secondary | ICD-10-CM | POA: Diagnosis not present

## 2021-03-23 DIAGNOSIS — G8929 Other chronic pain: Secondary | ICD-10-CM | POA: Diagnosis not present

## 2021-03-23 MED ORDER — METHOCARBAMOL 500 MG PO TABS: 500.0000 mg | ORAL_TABLET | Freq: Two times a day (BID) | ORAL | 0 refills | Status: DC

## 2021-03-23 MED ORDER — PREDNISONE 10 MG (21) PO TBPK: ORAL_TABLET | Freq: Every day | ORAL | 0 refills | Status: DC

## 2021-03-23 NOTE — ED Provider Notes (Signed)
[facility] DEPART[facility] Provider Note   CSN: [id number] Arrival date & time: 03/23/21  1429     History  Chief Complaint  Patient presents with   Arm Pain    Anthony Mills is a 61 y.o. male.  61 year old male presents today for evaluation of right shoulder pain of 3-week duration.  Patient was evaluated at primary care office last month where he had cervical x-ray done.  He denies any shoulder trauma or injury.  He is without fever, chills, weakness, numbness.  He does report radiculopathy down the right upper extremity.  He was treated with Robaxin and prednisone by his primary care provider with mild relief.  He reports he took the prednisone but did not pick up Robaxin at that time.  He has been using an over-the-counter brace for his right shoulder as well.  He has not started physical therapy.  He denies headache, visual change, or other complaints.   Arm Pain Pertinent negatives include no shortness of breath.      Home Medications Prior to Admission medications   Medication Sig Start Date End Date Taking? Authorizing Provider  rosuvastatin (CRESTOR) 20 MG tablet Take 20 mg by mouth daily.    [provider]      Allergies    Codeine    Review of Systems   Review of Systems  Constitutional:  Negative for activity change, chills and fever.  Respiratory:  Negative for shortness of breath.   Musculoskeletal:  Positive for arthralgias. Negative for joint swelling, neck pain and neck stiffness.  All other systems reviewed and are negative.  Physical Exam Updated Vital Signs BP 102/83 (BP Location: Left Arm)    Pulse 73    Temp 97.6 F (36.4 C) (Oral)    Resp 18    Ht 5\' 11"  (1.803 m)    Wt 86.2 kg    SpO2 100%    BMI 26.50 kg/m  Physical Exam Vitals and nursing note reviewed.  Constitutional:      General: He is not in acute distress.    Appearance: Normal appearance. He is not ill-appearing.  HENT:     Head: Normocephalic and  atraumatic.     Nose: Nose normal.  Eyes:     Conjunctiva/sclera: Conjunctivae normal.  Pulmonary:     Effort: Pulmonary effort is normal. No respiratory distress.  Musculoskeletal:        General: No swelling, tenderness or deformity. Normal range of motion.     Cervical back: Normal range of motion. No tenderness.     Comments: Right upper extremity without visual deformity or swelling.  Right shoulder, or cervical spine without tenderness to palpation.  Strength 5/5 in bilateral upper extremities.  2+ radial pulse present.  Sensation intact.  Skin:    Findings: No rash.  Neurological:     Mental Status: He is alert.    ED Results / Procedures / Treatments   Labs (all labs ordered are listed, but only abnormal results are displayed) Labs Reviewed - No data to display  EKG None  Radiology No results found.  Procedures Procedures    Medications Ordered in ED Medications - No data to display  ED Course/ Medical Decision Making/ A&P                           Medical Decision Making  Medical Decision Making / ED Course   This patient presents to the ED for  concern of right shoulder pain, this involves an extensive number of treatment options, and is a complaint that carries with it a high risk of complications and morbidity.  The differential diagnosis includes MSK injury, cervical radiculopathy  MDM: 61 year old male presents today for evaluation of right shoulder pain of 3-week duration.  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  Patient was treated with prednisone with improvement in his symptoms.  He reports over the past couple weeks when he ran out of his prednisone his symptoms returned.  His exam shows 5/5 strength in bilateral upper extremities with extensors and flexor muscle groups.  2+ radial pulse present and symmetrical.  Shoulder or cervical spine without  tenderness to palpation.  Range of motion mildly limited with shoulder flexion secondary.  He does report intermittent radicular pain down right upper extremity.  I did discuss with patient and obtaining CT cervical spine however patient defers this at this time and wishes to be treated with prednisone and Robaxin and will follow-up with primary care provider.  Given no injury or tenderness palpation of the right shoulder no indication for shoulder x-ray.  Reviewing primary care provider's note if his symptoms did not resolve with the treatment they would plan on physical therapy which she has not started at this time.  Return precautions discussed.  Patient voices understanding and is in agreement with plan.   Additional history obtained: -Additional history obtained from primary care provider's visit which confirmed degenerative changes of essentially and treatment with prednisone and Robaxin. -External records from outside source obtained and reviewed including: Chart review including previous notes, labs, imaging, consultation notes   Lab Tests: -I ordered, reviewed, and interpreted labs.   The pertinent results include:   Labs Reviewed - No data to display    EKG  EKG Interpretation  Date/Time:    Ventricular Rate:    PR Interval:    QRS Duration:   QT Interval:    QTC Calculation:   R Axis:     Text Interpretation:          Medicines ordered and prescription drug management: No orders of the defined types were placed in this encounter.   -I have reviewed the patients home medicines and have made adjustments as needed  Reevaluation: After the interventions noted above, I reevaluated the patient and found that they have :stayed the same  Co morbidities that complicate the patient evaluation  Past Medical History:  Diagnosis Date   Allergy    Bell's palsy    Bell's palsy 2018   Hyperlipidemia    Internal hemorrhoids       Dispostion: Patient is appropriate for  discharge.  Discharged in appropriate condition.   Final Clinical Impression(s) / ED Diagnoses Final diagnoses:  Chronic right shoulder pain    Rx / DC Orders ED Discharge Orders          Ordered    predniSONE (STERAPRED UNI-PAK 21 TAB) 10 MG (21) TBPK tablet  Daily        03/23/21 1553    methocarbamol (ROBAXIN) 500 MG tablet  2 times daily        03/23/21 1553              Evlyn Courier, PA-C 03/23/21 1554    Long, Wonda [facility], MD 03/29/21 1622

## 2021-03-23 NOTE — Discharge Instructions (Signed)
Your x-ray from your recent visit with your primary care provider showed you have degenerative changes of your cervical spine leading to your current symptoms.  I have sent in prednisone and muscle relaxer to the pharmacy of your choice.  If your symptoms significantly worsen you can return to the emergency room otherwise follow-up with your primary care provider.

## 2021-03-23 NOTE — ED Triage Notes (Signed)
Pt c/o pain to right UE x 3 weeks-pain is from shoulder to elbow-denies injury-states he was seen by PCP and was given prednisone-some relief but pain has returned-NAD-steady gait

## 2021-04-09 DIAGNOSIS — M5412 Radiculopathy, cervical region: Secondary | ICD-10-CM | POA: Diagnosis not present

## 2021-04-13 DIAGNOSIS — D709 Neutropenia, unspecified: Secondary | ICD-10-CM | POA: Diagnosis not present

## 2021-04-13 DIAGNOSIS — D869 Sarcoidosis, unspecified: Secondary | ICD-10-CM | POA: Diagnosis not present

## 2021-04-13 DIAGNOSIS — H2513 Age-related nuclear cataract, bilateral: Secondary | ICD-10-CM | POA: Diagnosis not present

## 2021-04-13 DIAGNOSIS — H3581 Retinal edema: Secondary | ICD-10-CM | POA: Diagnosis not present

## 2021-04-13 DIAGNOSIS — Z79899 Other long term (current) drug therapy: Secondary | ICD-10-CM | POA: Diagnosis not present

## 2021-04-13 DIAGNOSIS — D171 Benign lipomatous neoplasm of skin and subcutaneous tissue of trunk: Secondary | ICD-10-CM | POA: Diagnosis not present

## 2021-04-13 DIAGNOSIS — H30033 Focal chorioretinal inflammation, peripheral, bilateral: Secondary | ICD-10-CM | POA: Diagnosis not present

## 2021-04-27 DIAGNOSIS — D72810 Lymphocytopenia: Secondary | ICD-10-CM | POA: Diagnosis not present

## 2021-05-04 DIAGNOSIS — M25511 Pain in right shoulder: Secondary | ICD-10-CM | POA: Diagnosis not present

## 2021-05-04 DIAGNOSIS — M5412 Radiculopathy, cervical region: Secondary | ICD-10-CM | POA: Diagnosis not present

## 2021-05-04 DIAGNOSIS — G8929 Other chronic pain: Secondary | ICD-10-CM | POA: Diagnosis not present

## 2021-05-18 DIAGNOSIS — M25511 Pain in right shoulder: Secondary | ICD-10-CM | POA: Diagnosis not present

## 2021-05-18 DIAGNOSIS — G8929 Other chronic pain: Secondary | ICD-10-CM | POA: Diagnosis not present

## 2021-05-21 DIAGNOSIS — M25511 Pain in right shoulder: Secondary | ICD-10-CM | POA: Diagnosis not present

## 2021-05-21 DIAGNOSIS — G8929 Other chronic pain: Secondary | ICD-10-CM | POA: Diagnosis not present

## 2021-06-01 DIAGNOSIS — K219 Gastro-esophageal reflux disease without esophagitis: Secondary | ICD-10-CM | POA: Diagnosis not present

## 2021-08-17 DIAGNOSIS — G8929 Other chronic pain: Secondary | ICD-10-CM | POA: Diagnosis not present

## 2021-08-17 DIAGNOSIS — M75101 Unspecified rotator cuff tear or rupture of right shoulder, not specified as traumatic: Secondary | ICD-10-CM | POA: Diagnosis not present

## 2021-08-31 ENCOUNTER — Encounter: Payer: Self-pay | Admitting: Family Medicine

## 2021-08-31 ENCOUNTER — Ambulatory Visit: Payer: BC Managed Care – PPO | Admitting: Family Medicine

## 2021-08-31 VITALS — BP 112/76 | HR 87 | Temp 97.3°F | Ht 71.0 in | Wt 202.0 lb

## 2021-08-31 DIAGNOSIS — E7800 Pure hypercholesterolemia, unspecified: Secondary | ICD-10-CM

## 2021-08-31 NOTE — Progress Notes (Signed)
New Patient Office Visit  Subjective    Patient ID: Anthony Mills, male    DOB: 11/03/1960  Age: 61 y.o. MRN: [id number]  CC:  Chief Complaint  Patient presents with   Establish Care    New Pt, est care  Cholesterol concerns    HPI Severn Goddard presents to establish care In follow-up of his history of elevated cholesterol.  He has taken rosuvastatin without issue for 3 years.  He lives a healthy lifestyle.  He exercises regularly.  He lives alone.  Family history of breast cancer in his sister and mother.  Dad developed prostate cancer in his later years.  Patient drives a tractor trailer for living.  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  Chart review shows normal HDLs and LDLs.  Hemoglobin A1c was mildly elevated.  He has no history of diabetes.  He has regular dental care.  Outpatient Encounter Medications as of 08/31/2021  Medication Sig   rosuvastatin (CRESTOR) 20 MG tablet Take 20 mg by mouth daily.   UNABLE TO FIND Take 2 tablets by mouth daily. Med Name: Kyolic Cholesterol   methocarbamol (ROBAXIN) 500 MG tablet Take 1 tablet (500 mg total) by mouth 2 (two) times daily. (Patient not taking: Reported on 08/31/2021)   predniSONE (STERAPRED UNI-PAK 21 TAB) 10 MG (21) TBPK tablet Take by mouth daily. Take 6 tabs by mouth daily  for 2 days, then 5 tabs for 2 days, then 4 tabs for 2 days, then 3 tabs for 2 days, 2 tabs for 2 days, then 1 tab by mouth daily for 2 days (Patient not taking: Reported on 08/31/2021)   No facility-administered encounter medications on file as of 08/31/2021.    Past Medical History:  Diagnosis Date   Allergy    Bell's palsy    Bell's palsy 2018   Hyperlipidemia    Internal hemorrhoids     Past Surgical History:  Procedure Laterality Date   CIRCUMCISION     COLONOSCOPY     HERNIA REPAIR     KNEE SURGERY Left    left wrist surgery      Family History  Problem Relation Age of Onset   Asthma Nephew    Breast cancer Mother     Prostate cancer Father    Breast cancer Sister    Colon cancer Neg Hx    Esophageal cancer Neg Hx    Liver cancer Neg Hx    Pancreatic cancer Neg Hx    Rectal cancer Neg Hx    Stomach cancer Neg Hx    Colon polyps Neg Hx     Social History   Socioeconomic History   Marital status: Divorced    Spouse name: Not on file   Number of children: Not on file   Years of education: Not on file   Highest education level: Not on file  Occupational History   Not on file  Tobacco Use   Smoking status: Never   Smokeless tobacco: Never  Vaping Use   Vaping Use: Never used  Substance and Sexual Activity   Alcohol use: No   Drug use: No   Sexual activity: Yes  Other Topics Concern   Not on file  Social History Narrative   Not on file   Social Determinants of Health   Financial Resource Strain: Not on file  Food Insecurity: Not on file  Transportation Needs: Not on file  Physical Activity: Not on file  Stress:  Not on file  Social Connections: Not on file  Intimate Partner Violence: Not on file    Review of Systems  Constitutional: Negative.   HENT: Negative.    Eyes:  Negative for blurred vision, discharge and redness.  Respiratory: Negative.    Cardiovascular: Negative.   Gastrointestinal:  Negative for abdominal pain.  Genitourinary: Negative.   Musculoskeletal: Negative.  Negative for myalgias.  Skin:  Negative for rash.  Neurological:  Negative for tingling, loss of consciousness and weakness.  Endo/Heme/Allergies:  Negative for polydipsia.        Objective    BP 112/76 (BP Location: Right Arm, Patient Position: Sitting, Cuff Size: Normal)   Pulse 87   Temp (!) 97.3 F (36.3 C) (Temporal)   Ht '5\' 11"'$  (1.803 m)   Wt 202 lb (91.6 kg)   SpO2 98%   BMI 28.17 kg/m   Physical Exam Vitals and nursing note reviewed.  Constitutional:      General: He is not in acute distress.    Appearance: He is well-developed. He is not diaphoretic.  HENT:     Head:  Normocephalic and atraumatic.     Right Ear: External ear normal.     Left Ear: External ear normal.     Nose: Nose normal.  Eyes:     General: No scleral icterus.       Right eye: No discharge.        Left eye: No discharge.     Conjunctiva/sclera: Conjunctivae normal.  Neck:     Thyroid: No thyromegaly.     Vascular: No JVD.     Trachea: No tracheal deviation.  Cardiovascular:     Rate and Rhythm: Normal rate and regular rhythm.     Heart sounds: Normal heart sounds. No murmur heard.    No friction rub. No gallop.  Pulmonary:     Effort: Pulmonary effort is normal. No respiratory distress.     Breath sounds: Normal breath sounds. No stridor. No wheezing or rales.  Musculoskeletal:        General: No tenderness or deformity. Normal range of motion.     Cervical back: Normal range of motion and neck supple.  Skin:    General: Skin is warm and dry.  Neurological:     Mental Status: He is alert and oriented to person, place, and time.     Deep Tendon Reflexes: Reflexes are normal and symmetric.  Psychiatric:        Behavior: Behavior normal.        Thought Content: Thought content normal.         Assessment & Plan:   Problem List Items Addressed This Visit   None Visit Diagnoses     Elevated LDL cholesterol level    -  Primary       Return Return fasting for physical exam at your convenience.   Libby Maw, MD

## 2021-09-11 ENCOUNTER — Telehealth: Payer: Self-pay | Admitting: Family Medicine

## 2021-09-11 DIAGNOSIS — M7581 Other shoulder lesions, right shoulder: Secondary | ICD-10-CM | POA: Diagnosis not present

## 2021-09-11 DIAGNOSIS — M75101 Unspecified rotator cuff tear or rupture of right shoulder, not specified as traumatic: Secondary | ICD-10-CM | POA: Diagnosis not present

## 2021-09-11 NOTE — Telephone Encounter (Signed)
Caller Name: Anthony Mills Call back phone #: [phone]  Reason for Call: Pt wanted to schedule an appt with Dr. Wilfred Lacy as a TOC, his first appointment here was 08/31/2021 as a new patient for Dr. Ethelene Hal. He did not want to provide me with information as to why he requested the switch.

## 2021-09-12 NOTE — Telephone Encounter (Signed)
Called & spoke w/ pt, Scheduled CPE w/ [place] on 09/28/21.

## 2021-09-24 ENCOUNTER — Telehealth: Payer: Self-pay | Admitting: Nurse Practitioner

## 2021-09-24 NOTE — Telephone Encounter (Signed)
error 

## 2021-09-28 ENCOUNTER — Ambulatory Visit: Payer: BC Managed Care – PPO | Admitting: Nurse Practitioner

## 2021-09-28 ENCOUNTER — Other Ambulatory Visit: Payer: Self-pay

## 2021-09-28 ENCOUNTER — Encounter: Payer: BC Managed Care – PPO | Admitting: Nurse Practitioner

## 2021-09-28 ENCOUNTER — Encounter: Payer: Self-pay | Admitting: Nurse Practitioner

## 2021-09-28 ENCOUNTER — Other Ambulatory Visit (HOSPITAL_COMMUNITY)
Admission: RE | Admit: 2021-09-28 | Discharge: 2021-09-28 | Disposition: A | Discharge status: Home / Self Care | Payer: BC Managed Care – PPO | Source: Ambulatory Visit | Attending: Nurse Practitioner | Admitting: Nurse Practitioner

## 2021-09-28 VITALS — BP 138/88 | HR 74 | Temp 97.0°F | Ht 71.0 in | Wt 195.8 lb

## 2021-09-28 DIAGNOSIS — E7800 Pure hypercholesterolemia, unspecified: Secondary | ICD-10-CM | POA: Diagnosis not present

## 2021-09-28 DIAGNOSIS — N401 Enlarged prostate with lower urinary tract symptoms: Secondary | ICD-10-CM

## 2021-09-28 DIAGNOSIS — R3915 Urgency of urination: Secondary | ICD-10-CM | POA: Insufficient documentation

## 2021-09-28 DIAGNOSIS — N529 Male erectile dysfunction, unspecified: Secondary | ICD-10-CM | POA: Diagnosis not present

## 2021-09-28 DIAGNOSIS — R7303 Prediabetes: Secondary | ICD-10-CM | POA: Diagnosis not present

## 2021-09-28 DIAGNOSIS — E1169 Type 2 diabetes mellitus with other specified complication: Secondary | ICD-10-CM | POA: Insufficient documentation

## 2021-09-28 LAB — LIPID PANEL
Cholesterol: 178 mg/dL (ref 0–200)
HDL: 62.4 mg/dL (ref >39.00)
LDL Cholesterol: 104 mg/dL — ABNORMAL HIGH (ref 0–99)
NonHDL: 115.28
Total CHOL/HDL Ratio: 3
Triglycerides: 55 mg/dL (ref 0.0–149.0)
VLDL: 11 mg/dL (ref 0.0–40.0)

## 2021-09-28 LAB — CBC
HCT: 46.4 % (ref 39.0–52.0)
Hemoglobin: 14.9 g/dL (ref 13.0–17.0)
MCHC: 32.1 g/dL (ref 30.0–36.0)
MCV: 88.3 fl (ref 78.0–100.0)
Platelets: 188 10*3/uL (ref 150.0–400.0)
RBC: 5.25 Mil/uL (ref 4.22–5.81)
RDW: 13.8 % (ref 11.5–15.5)
WBC: 5.9 10*3/uL (ref 4.0–10.5)

## 2021-09-28 LAB — PSA: PSA: 0.95 ng/mL (ref 0.10–4.00)

## 2021-09-28 LAB — HEMOGLOBIN A1C: Hgb A1c MFr Bld: 6 % (ref 4.6–6.5)

## 2021-09-28 MED ORDER — ROSUVASTATIN CALCIUM 20 MG PO TABS: 20.0000 mg | ORAL_TABLET | Freq: Every day | ORAL | 2 refills | Status: DC

## 2021-09-28 MED ORDER — TADALAFIL 5 MG PO TABS: 5.0000 mg | ORAL_TABLET | Freq: Every day | ORAL | 5 refills | Status: DC

## 2021-09-28 NOTE — Patient Instructions (Signed)
Go to lab You will be contacted to schedule appt with urology Start cialis '5mg'$  daily

## 2021-09-28 NOTE — Assessment & Plan Note (Signed)
Persistent Not sexually active x 87yr No hematuria  Start cialis '5mg'$  daily Check PSA, urinalysis, and entered referral to urology

## 2021-09-28 NOTE — Progress Notes (Signed)
Established Patient Visit  Patient: Anthony Mills   DOB: 11-Oct-1960   61 y.o. Male  MRN: [id number] Visit Date: 09/28/2021  Subjective:    Chief Complaint  Patient presents with  . Establish Care    TOC cholesterol  No other concerns     HPI Transfer from Dr. Ethelene Hal  Prediabetes Repeat hgbA1c Encourage to maintain a heart healthy diet  Benign prostatic hyperplasia (BPH) with urinary urgency Persistent Not sexually active x 51yr No hematuria  Start cialis '5mg'$  daily Check PSA, urinalysis, and entered referral to urology  Erectile dysfunction Onset 2017 No AM erection No tobacco or ETOH or marijuana or iilicit drug use No hx of DM or HTN  Entered urology referral  Elevated LDL cholesterol level Use of crestor with no side effects Repeat lipid panel  Reviewed medical, surgical, and social history today  Medications: Outpatient Medications Prior to Visit  Medication Sig  . rosuvastatin (CRESTOR) 20 MG tablet Take 20 mg by mouth daily.  .Marland KitchenUNABLE TO FIND Take 2 tablets by mouth daily. Med Name: Kyolic Cholesterol  . methocarbamol (ROBAXIN) 500 MG tablet Take 1 tablet (500 mg total) by mouth 2 (two) times daily. (Patient not taking: Reported on 08/31/2021)  . [DISCONTINUED] predniSONE (STERAPRED UNI-PAK 21 TAB) 10 MG (21) TBPK tablet Take by mouth daily. Take 6 tabs by mouth daily  for 2 days, then 5 tabs for 2 days, then 4 tabs for 2 days, then 3 tabs for 2 days, 2 tabs for 2 days, then 1 tab by mouth daily for 2 days (Patient not taking: Reported on 08/31/2021)   No facility-administered medications prior to visit.   Reviewed past medical and social history.   ROS per HPI above      Objective:  BP 138/88 (BP Location: Right Arm, Patient Position: Sitting, Cuff Size: Normal)   Pulse 74   Temp (!) 97 F (36.1 C) (Temporal)   Ht '5\' 11"'$  (1.803 m)   Wt 195 lb 12.8 oz (88.8 kg)   SpO2 97%   BMI 27.31 kg/m      Physical Exam Cardiovascular:      Rate and Rhythm: Normal rate and regular rhythm.     Pulses: Normal pulses.     Heart sounds: Normal heart sounds.  Pulmonary:     Effort: Pulmonary effort is normal.     Breath sounds: Normal breath sounds.  Musculoskeletal:     Right lower leg: No edema.     Left lower leg: No edema.  Neurological:     Mental Status: He is alert and oriented to person, place, and time.  Psychiatric:        Mood and Affect: Mood normal.        Behavior: Behavior normal.        Thought Content: Thought content normal.    No results found for any visits on 09/28/21.    Assessment & Plan:    Problem List Items Addressed This Visit       Other   Benign prostatic hyperplasia (BPH) with urinary urgency - Primary    Persistent Not sexually active x 556yrNo hematuria  Start cialis '5mg'$  daily Check PSA, urinalysis, and entered referral to urology      Relevant Medications   tadalafil (CIALIS) 5 MG tablet   Other Relevant Orders   PSA   Ambulatory referral to Urology   Urinalysis w microscopic +  reflex cultur   Urine cytology ancillary only   Elevated LDL cholesterol level    Use of crestor with no side effects Repeat lipid panel      Relevant Orders   Lipid panel   Erectile dysfunction    Onset 2017 No AM erection No tobacco or ETOH or marijuana or iilicit drug use No hx of DM or HTN  Entered urology referral      Relevant Medications   tadalafil (CIALIS) 5 MG tablet   Other Relevant Orders   CBC   Ambulatory referral to Urology   Prediabetes    Repeat hgbA1c Encourage to maintain a heart healthy diet      Relevant Orders   Hemoglobin A1c   Return in about 4 weeks (around 10/26/2021) for ED, BPH and , hyperlipidemia.     Wilfred Lacy, NP

## 2021-09-28 NOTE — Assessment & Plan Note (Signed)
Onset 2017 No AM erection No tobacco or ETOH or marijuana or iilicit drug use No hx of DM or HTN  Entered urology referral

## 2021-09-28 NOTE — Assessment & Plan Note (Signed)
>>  ASSESSMENT AND PLAN FOR PREDIABETES WRITTEN ON 09/28/2021  1:03 PM BY Chavis Tessler LUM, NP  Repeat hgbA1c Encourage to maintain a heart healthy diet

## 2021-09-28 NOTE — Assessment & Plan Note (Signed)
Use of crestor with no side effects Repeat lipid panel

## 2021-09-28 NOTE — Assessment & Plan Note (Signed)
Repeat hgbA1c Encourage to maintain a heart healthy diet

## 2021-09-29 LAB — URINALYSIS W MICROSCOPIC + REFLEX CULTURE
Bacteria, UA: NONE SEEN /HPF
Bilirubin Urine: NEGATIVE
Glucose, UA: NEGATIVE
Hgb urine dipstick: NEGATIVE
Hyaline Cast: NONE SEEN /LPF
Ketones, ur: NEGATIVE
Leukocyte Esterase: NEGATIVE
Nitrites, Initial: NEGATIVE
Protein, ur: NEGATIVE
Specific Gravity, Urine: 1.023 (ref 1.001–1.035)
Squamous Epithelial / HPF: NONE SEEN /HPF (ref <=5)
WBC, UA: NONE SEEN /HPF (ref 0–5)
pH: 5.5 (ref 5.0–8.0)

## 2021-09-29 LAB — NO CULTURE INDICATED

## 2021-10-01 LAB — URINE CYTOLOGY ANCILLARY ONLY
Chlamydia: NEGATIVE
Comment: NEGATIVE
Comment: NEGATIVE
Comment: NORMAL
Neisseria Gonorrhea: NEGATIVE
Trichomonas: NEGATIVE

## 2021-10-24 ENCOUNTER — Encounter (HOSPITAL_COMMUNITY): Payer: Self-pay | Admitting: Surgery

## 2021-10-24 ENCOUNTER — Other Ambulatory Visit: Payer: Self-pay

## 2021-10-24 ENCOUNTER — Ambulatory Visit: Payer: Self-pay | Admitting: Surgery

## 2021-10-24 NOTE — Progress Notes (Signed)
PCP - Dr. Reece Levy  Cardiologist - Denies  EP- Denies  Endocrine- Denies  Pulm- Denies  Chest x-ray -  Denies  EKG - Denies  Stress Test - Denies  ECHO - Denies  Cardiac Cath - Denies  AICD-na PM-na LOOP-na  Nerve Stimulator- Denies  Dialysis- Denies  Sleep Study - Denies CPAP - Denies  LABS- 09/28/21(E): CBC w/D  ASA- Denies  ERAS- Yes- clears until 0600  HA1C- Denies  Anesthesia- No  Pt denies having chest pain, sob, or fever during the pre-op phone call. All instructions explained to the pt, with a verbal understanding of the material including: as of today, stop taking all Aspirin (unless instructed by your doctor) and Other Aspirin containing products, Vitamins, Fish oils, and Herbal medications. Also stop all NSAIDS i.e. Advil, Ibuprofen, Motrin, Aleve, Anaprox, Naproxen, BC, Goody Powders, and all Supplements.  Pt also instructed to wear a mask and social distance if he goes out. The opportunity to ask questions was provided.

## 2021-10-25 ENCOUNTER — Encounter (HOSPITAL_COMMUNITY): Payer: Self-pay | Admitting: Surgery

## 2021-10-25 ENCOUNTER — Ambulatory Visit (HOSPITAL_COMMUNITY)
Admission: RE | Admit: 2021-10-25 | Discharge: 2021-10-25 | Disposition: A | Discharge status: Home / Self Care | Payer: BC Managed Care – PPO | Source: Ambulatory Visit | Attending: Surgery | Admitting: Surgery

## 2021-10-25 ENCOUNTER — Ambulatory Visit (HOSPITAL_COMMUNITY): Payer: BC Managed Care – PPO | Admitting: Anesthesiology

## 2021-10-25 ENCOUNTER — Other Ambulatory Visit: Payer: Self-pay

## 2021-10-25 ENCOUNTER — Encounter (HOSPITAL_COMMUNITY)
Admission: RE | Disposition: A | Discharge status: Home / Self Care | Payer: Self-pay | Source: Ambulatory Visit | Attending: Surgery

## 2021-10-25 DIAGNOSIS — G709 Myoneural disorder, unspecified: Secondary | ICD-10-CM | POA: Insufficient documentation

## 2021-10-25 DIAGNOSIS — K219 Gastro-esophageal reflux disease without esophagitis: Secondary | ICD-10-CM | POA: Insufficient documentation

## 2021-10-25 DIAGNOSIS — D170 Benign lipomatous neoplasm of skin and subcutaneous tissue of head, face and neck: Secondary | ICD-10-CM | POA: Insufficient documentation

## 2021-10-25 DIAGNOSIS — R220 Localized swelling, mass and lump, head: Secondary | ICD-10-CM | POA: Diagnosis not present

## 2021-10-25 DIAGNOSIS — M7989 Other specified soft tissue disorders: Secondary | ICD-10-CM | POA: Diagnosis not present

## 2021-10-25 HISTORY — PX: MASS EXCISION: SHX2000

## 2021-10-25 HISTORY — DX: Other complications of anesthesia, initial encounter: T88.59XA

## 2021-10-25 SURGERY — EXCISION MASS
Anesthesia: General | Site: Scalp

## 2021-10-25 MED ORDER — MIDAZOLAM HCL 2 MG/2ML IJ SOLN
INTRAMUSCULAR | Status: DC | PRN
  Administered 2021-10-25: 2 mg via INTRAVENOUS

## 2021-10-25 MED ORDER — ROCURONIUM BROMIDE 10 MG/ML (PF) SYRINGE
PREFILLED_SYRINGE | INTRAVENOUS | Status: AC
  Filled 2021-10-25: qty 10

## 2021-10-25 MED ORDER — CHLORHEXIDINE GLUCONATE CLOTH 2 % EX PADS: 6.0000 | MEDICATED_PAD | Freq: Once | CUTANEOUS | Status: DC

## 2021-10-25 MED ORDER — OXYCODONE HCL 5 MG PO TABS: 5.0000 mg | ORAL_TABLET | Freq: Once | ORAL | Status: DC | PRN

## 2021-10-25 MED ORDER — LIDOCAINE 2% (20 MG/ML) 5 ML SYRINGE
INTRAMUSCULAR | Status: AC
  Filled 2021-10-25: qty 5

## 2021-10-25 MED ORDER — FENTANYL CITRATE (PF) 100 MCG/2ML IJ SOLN: 25.0000 ug | INTRAMUSCULAR | Status: DC | PRN

## 2021-10-25 MED ORDER — CEFAZOLIN SODIUM-DEXTROSE 2-4 GM/100ML-% IV SOLN
2.0000 g | INTRAVENOUS | Status: AC
  Administered 2021-10-25: 2 g via INTRAVENOUS
  Filled 2021-10-25: qty 100

## 2021-10-25 MED ORDER — DOCUSATE SODIUM 100 MG PO CAPS: 100.0000 mg | ORAL_CAPSULE | Freq: Two times a day (BID) | ORAL | 2 refills | Status: DC

## 2021-10-25 MED ORDER — BUPIVACAINE-EPINEPHRINE 0.5% -1:200000 IJ SOLN
INTRAMUSCULAR | Status: DC | PRN
  Administered 2021-10-25: 40 mL

## 2021-10-25 MED ORDER — LIDOCAINE 2% (20 MG/ML) 5 ML SYRINGE
INTRAMUSCULAR | Status: DC | PRN
  Administered 2021-10-25: 50 mg via INTRAVENOUS

## 2021-10-25 MED ORDER — OXYCODONE HCL 5 MG/5ML PO SOLN: 5.0000 mg | Freq: Once | ORAL | Status: DC | PRN

## 2021-10-25 MED ORDER — ONDANSETRON HCL 4 MG/2ML IJ SOLN
INTRAMUSCULAR | Status: DC | PRN
  Administered 2021-10-25: 4 mg via INTRAVENOUS

## 2021-10-25 MED ORDER — LACTATED RINGERS IV SOLN: INTRAVENOUS | Status: DC

## 2021-10-25 MED ORDER — PROPOFOL 10 MG/ML IV BOLUS
INTRAVENOUS | Status: DC | PRN
  Administered 2021-10-25: 200 mg via INTRAVENOUS

## 2021-10-25 MED ORDER — PROPOFOL 10 MG/ML IV BOLUS
INTRAVENOUS | Status: AC
  Filled 2021-10-25: qty 20

## 2021-10-25 MED ORDER — DEXAMETHASONE SODIUM PHOSPHATE 10 MG/ML IJ SOLN
INTRAMUSCULAR | Status: AC
  Filled 2021-10-25: qty 1

## 2021-10-25 MED ORDER — CHLORHEXIDINE GLUCONATE 0.12 % MT SOLN
15.0000 mL | OROMUCOSAL | Status: AC
  Administered 2021-10-25: 15 mL via OROMUCOSAL
  Filled 2021-10-25 (×2): qty 15

## 2021-10-25 MED ORDER — DEXAMETHASONE SODIUM PHOSPHATE 10 MG/ML IJ SOLN
INTRAMUSCULAR | Status: DC | PRN
  Administered 2021-10-25: 5 mg via INTRAVENOUS

## 2021-10-25 MED ORDER — MIDAZOLAM HCL 2 MG/2ML IJ SOLN
INTRAMUSCULAR | Status: AC
  Filled 2021-10-25: qty 2

## 2021-10-25 MED ORDER — ONDANSETRON HCL 4 MG/2ML IJ SOLN
INTRAMUSCULAR | Status: AC
  Filled 2021-10-25: qty 2

## 2021-10-25 MED ORDER — OXYCODONE HCL 5 MG PO TABS: 5.0000 mg | ORAL_TABLET | ORAL | 0 refills | Status: DC | PRN

## 2021-10-25 MED ORDER — ONDANSETRON HCL 4 MG/2ML IJ SOLN: 4.0000 mg | Freq: Four times a day (QID) | INTRAMUSCULAR | Status: DC | PRN

## 2021-10-25 MED ORDER — ACETAMINOPHEN 500 MG PO TABS: 1000.0000 mg | ORAL_TABLET | Freq: Four times a day (QID) | ORAL | 2 refills | Status: DC

## 2021-10-25 MED ORDER — SUGAMMADEX SODIUM 200 MG/2ML IV SOLN
INTRAVENOUS | Status: DC | PRN
  Administered 2021-10-25: 200 mg via INTRAVENOUS

## 2021-10-25 MED ORDER — BUPIVACAINE-EPINEPHRINE (PF) 0.5% -1:200000 IJ SOLN
INTRAMUSCULAR | Status: AC
  Filled 2021-10-25: qty 30

## 2021-10-25 MED ORDER — FENTANYL CITRATE (PF) 100 MCG/2ML IJ SOLN
INTRAMUSCULAR | Status: DC | PRN
Start: 2021-10-25 — End: 2021-10-25
  Administered 2021-10-25 (×2): 50 ug via INTRAVENOUS
  Administered 2021-10-25: 100 ug via INTRAVENOUS

## 2021-10-25 MED ORDER — IBUPROFEN 600 MG PO TABS: 600.0000 mg | ORAL_TABLET | Freq: Four times a day (QID) | ORAL | 1 refills | Status: DC

## 2021-10-25 MED ORDER — FENTANYL CITRATE (PF) 250 MCG/5ML IJ SOLN
INTRAMUSCULAR | Status: AC
  Filled 2021-10-25: qty 5

## 2021-10-25 MED ORDER — ROCURONIUM BROMIDE 10 MG/ML (PF) SYRINGE
PREFILLED_SYRINGE | INTRAVENOUS | Status: DC | PRN
  Administered 2021-10-25: 60 mg via INTRAVENOUS

## 2021-10-25 MED ORDER — METHOCARBAMOL 750 MG PO TABS: 750.0000 mg | ORAL_TABLET | Freq: Four times a day (QID) | ORAL | 1 refills | Status: DC

## 2021-10-25 MED ORDER — 0.9 % SODIUM CHLORIDE (POUR BTL) OPTIME
TOPICAL | Status: DC | PRN
  Administered 2021-10-25: 1000 mL

## 2021-10-25 SURGICAL SUPPLY — 44 items
APL PRP STRL LF DISP 70% ISPRP (MISCELLANEOUS) ×1
BAG COUNTER SPONGE SURGICOUNT (BAG) ×2 IMPLANT
BAG SPNG CNTER NS LX DISP (BAG) ×1
BLADE SURG 15 STRL LF DISP TIS (BLADE) IMPLANT
BLADE SURG 15 STRL SS (BLADE) ×1
CANISTER SUCT 3000ML PPV (MISCELLANEOUS) IMPLANT
CHLORAPREP W/TINT 26 (MISCELLANEOUS) ×2 IMPLANT
CNTNR URN SCR LID CUP LEK RST (MISCELLANEOUS) IMPLANT
CONT SPEC 4OZ STRL OR WHT (MISCELLANEOUS) ×2
COVER SURGICAL LIGHT HANDLE (MISCELLANEOUS) ×2 IMPLANT
DRAPE LAPAROTOMY 100X72 PEDS (DRAPES) ×2 IMPLANT
DRSG TEGADERM 2-3/8X2-3/4 SM (GAUZE/BANDAGES/DRESSINGS) IMPLANT
DRSG XEROFORM 1X8 (GAUZE/BANDAGES/DRESSINGS) IMPLANT
ELECT NDL BLADE 2-5/6 (NEEDLE) IMPLANT
ELECT NEEDLE BLADE 2-5/6 (NEEDLE) ×1 IMPLANT
ELECT REM PT RETURN 9FT ADLT (ELECTROSURGICAL) ×1
ELECTRODE REM PT RTRN 9FT ADLT (ELECTROSURGICAL) ×2 IMPLANT
GAUZE SPONGE 4X4 12PLY STRL (GAUZE/BANDAGES/DRESSINGS) IMPLANT
GAUZE XEROFORM 1X8 LF (GAUZE/BANDAGES/DRESSINGS) IMPLANT
GLOVE BIO SURGEON STRL SZ7 (GLOVE) IMPLANT
GLOVE BIOGEL PI IND STRL 7.5 (GLOVE) IMPLANT
GLOVE BIOGEL PI IND STRL 8 (GLOVE) ×2 IMPLANT
GLOVE BIOGEL PI INDICATOR 7.5 (GLOVE) ×1
GLOVE BIOGEL PI INDICATOR 8 (GLOVE) ×1
GOWN STRL REUS W/ TWL LRG LVL3 (GOWN DISPOSABLE) ×2 IMPLANT
GOWN STRL REUS W/ TWL XL LVL3 (GOWN DISPOSABLE) ×2 IMPLANT
GOWN STRL REUS W/TWL LRG LVL3 (GOWN DISPOSABLE) ×1
GOWN STRL REUS W/TWL XL LVL3 (GOWN DISPOSABLE) ×1
KIT BASIN OR (CUSTOM PROCEDURE TRAY) ×2 IMPLANT
KIT TURNOVER KIT B (KITS) ×2 IMPLANT
NDL HYPO 25X1 1.5 SAFETY (NEEDLE) ×2 IMPLANT
NEEDLE HYPO 25X1 1.5 SAFETY (NEEDLE) ×1 IMPLANT
NS IRRIG 1000ML POUR BTL (IV SOLUTION) ×2 IMPLANT
PACK GENERAL/GYN (CUSTOM PROCEDURE TRAY) ×2 IMPLANT
PAD ARMBOARD 7.5X6 YLW CONV (MISCELLANEOUS) ×2 IMPLANT
PENCIL SMOKE EVACUATOR (MISCELLANEOUS) ×2 IMPLANT
SUCTION FRAZIER HANDLE 10FR (MISCELLANEOUS) ×1
SUCTION TUBE FRAZIER 10FR DISP (MISCELLANEOUS) IMPLANT
SUT MNCRL AB 4-0 PS2 18 (SUTURE) ×2 IMPLANT
SUT VIC AB 3-0 SH 27 (SUTURE) ×4
SUT VIC AB 3-0 SH 27X BRD (SUTURE) ×2 IMPLANT
SYR CONTROL 10ML LL (SYRINGE) ×2 IMPLANT
TOWEL GREEN STERILE (TOWEL DISPOSABLE) ×2 IMPLANT
TOWEL GREEN STERILE FF (TOWEL DISPOSABLE) ×2 IMPLANT

## 2021-10-25 NOTE — Anesthesia Preprocedure Evaluation (Signed)
Anesthesia Evaluation  Patient identified by MRN, date of birth, ID band Patient awake    Reviewed: Allergy & Precautions, H&P , NPO status , Patient's Chart, lab work & pertinent test results  Airway Mallampati: II   Neck ROM: full    Dental   Pulmonary neg pulmonary ROS,    breath sounds clear to auscultation       Cardiovascular negative cardio ROS   Rhythm:regular Rate:Normal     Neuro/Psych  Headaches,  Neuromuscular disease    GI/Hepatic GERD  ,  Endo/Other    Renal/GU      Musculoskeletal   Abdominal   Peds  Hematology   Anesthesia Other Findings   Reproductive/Obstetrics                             Anesthesia Physical Anesthesia Plan  ASA: 2  Anesthesia Plan: General   Post-op Pain Management:    Induction: Intravenous  PONV Risk Score and Plan: 2 and Ondansetron, Dexamethasone, Midazolam and Treatment may vary due to age or medical condition  Airway Management Planned: Oral ETT  Additional Equipment:   Intra-op Plan:   Post-operative Plan: Extubation in OR  Informed Consent: I have reviewed the patients History and Physical, chart, labs and discussed the procedure including the risks, benefits and alternatives for the proposed anesthesia with the patient or authorized representative who has indicated his/her understanding and acceptance.     Dental advisory given  Plan Discussed with: CRNA, Anesthesiologist and Surgeon  Anesthesia Plan Comments:         Anesthesia Quick Evaluation

## 2021-10-25 NOTE — Discharge Instructions (Signed)
May shower beginning 10/26/21, but keep wounds dry. May remove dressing 10/27/2021. Beginning 11/01/21, may use shampoo and may allow warm soapy water to run over incisions, then rinse and pat dry. Do not peel off or scrub skin glue or incisions. Do not soak in any water (tubs, hot tubs, pools, lakes, oceans) for two weeks.   Pain regimen: take over-the-counter tylenol (acetaminophen) '1000mg'$  every six hours, the prescription ibuprofen ('600mg'$ ) every six hours and the robaxin (methocarbamol) '750mg'$  every six hours. With all three of these, you should be taking something every two hours. Example: tylenol ( acetaminophen) at 8am, ibuprofen at 10am, robaxin (methocarbamol) at 12pm, tylenol (acetaminophen) again at 2pm, ibuprofen again at 4pm, robaxin (methocarbamol) at 6pm. You also have a prescription for oxycodone, which should be taken if the tylenol (acetaminophen), ibuprofen, and robaxin (methocarbamol) are not enough to control your pain. You may take the oxycodone as frequently as every four hours as needed, but if you are taking the other medications as above, you should not need the oxycodone this frequently. You have also been given a prescription for colace (docusate) which is a stool softener. Please take this as prescribed because the oxycodone can cause constipation and the colace (docusate) will minimize or prevent constipation. Do not drive while taking or under the influence of the oxycodone as it is a narcotic medication.  Call the office at [phone] for temperature greater than 101.30F, worsening pain, redness or warmth at the incision site.  Please call [phone] to make an appointment for 2-3 weeks after surgery for wound check. Please send in pictures of the wound 1 week after surgery to [email or web address]

## 2021-10-25 NOTE — Op Note (Signed)
   Operative Note   Date: 10/25/2021  Procedure: excision of scalp mass x2  Pre-op diagnosis: scalp mass x2 Post-op diagnosis: scalp mass 1x1cm, 2x2cm  Indication and clinical history: The patient is a 61 y.o. year old male with scalp mass x2     Surgeon: Jesusita Oka, MD  Anesthesia: General  Findings:  Specimen: scalp mass x2 EBL: <5cc Drains/Implants: none  Disposition: PACU - hemodynamically stable.  Description of procedure: The patient was positioned supine on the operating room table. General anesthetic induction and intubation were uneventful. Foley catheter insertion was performed and was atraumatic. Time-out was performed verifying correct patient, procedure, signature of informed consent, and administration of pre-operative antibiotics. The scalp was prepped and draped in the usual sterile fashion.  An incision was made over the right posterior scalp mass and the mass circumferentially excised. 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 Hemostasis was achieved and the wound closed in layers using vicryl and monocryl. An incision was then made over the right anterior scalp mass. The mass was circumferentially excised. This mass was also visually consistent with lipoma and sent to pathology for confirmatory diagnosis. Hemostasis was achieved and the wound closed in layers using vicryl and monocryl. Dermabond was applied.   All sponge and instrument counts were correct at the conclusion of the procedure. The patient was awakened from anesthesia, extubated uneventfully, and transported to the PACU in good condition. There were no complications.     Jesusita Oka, MD General and [facility]

## 2021-10-25 NOTE — Anesthesia Procedure Notes (Signed)
Procedure Name: Intubation Date/Time: 10/25/2021 9:20 AM  Performed by: Moshe Salisbury, CRNAPre-anesthesia Checklist: Patient identified, Emergency Drugs available, Suction available and Patient being monitored Patient Re-evaluated:Patient Re-evaluated prior to induction Oxygen Delivery Method: Circle System Utilized Preoxygenation: Pre-oxygenation with 100% oxygen Induction Type: IV induction Ventilation: Mask ventilation without difficulty Laryngoscope Size: Mac and 4 Grade View: Grade II Tube type: Oral Tube size: 8.0 mm Number of attempts: 1 Airway Equipment and Method: Stylet Placement Confirmation: ETT inserted through vocal cords under direct vision, positive ETCO2 and breath sounds checked- equal and bilateral Secured at: 22 cm Tube secured with: Tape Dental Injury: Teeth and Oropharynx as per pre-operative assessment

## 2021-10-25 NOTE — H&P (Signed)
    Anthony Mills is an 61 y.o. male.   HPI: [name] with scalp masses x2. Plan for excision today. The patient has had no hospitalizations, ER visits, surgeries, or newly diagnosed allergies since being seen in the office. Has seen a new PCP, Dr. Inocente Mills, to establish care.    Past Medical History:  Diagnosis Date   Allergy    Bell's palsy    Bell's palsy 5400   Complication of anesthesia    hard to wake up   Hyperlipidemia    Internal hemorrhoids     Past Surgical History:  Procedure Laterality Date   CIRCUMCISION     COLONOSCOPY     HERNIA REPAIR     KNEE SURGERY Left    left wrist surgery      Family History  Problem Relation Age of Onset   Asthma Nephew    Breast cancer Mother    Prostate cancer Father    Breast cancer Sister    Colon cancer Neg Hx    Esophageal cancer Neg Hx    Liver cancer Neg Hx    Pancreatic cancer Neg Hx    Rectal cancer Neg Hx    Stomach cancer Neg Hx    Colon polyps Neg Hx     Social History:  reports that he has never smoked. He has never used smokeless tobacco. He reports that he does not drink alcohol and does not use drugs.  Allergies:  Allergies  Allergen Reactions   Codeine Other (See Comments)    hallucinate    Medications: I have reviewed the patient's current medications.  No results found for this or any previous visit (from the past 48 hour(s)).  No results found.  ROS 10 point review of systems is negative except as listed above in HPI.   Physical Exam Blood pressure (!) 135/93, pulse 73, temperature 97.9 F (36.6 C), temperature source Oral, resp. rate 18, height 6' (1.829 m), weight 99.8 kg, SpO2 99 %. Constitutional: well-developed, well-nourished HEENT: pupils equal, round, reactive to light, 66m b/l, moist conjunctiva, external inspection of ears and nose normal, hearing intact, R sided scalp mass x2 Oropharynx: normal oropharyngeal mucosa, normal dentition Neck: no thyromegaly, trachea midline,  no midline cervical tenderness to palpation Chest: breath sounds equal bilaterally, normal respiratory effort, no midline or lateral chest Swatek tenderness to palpation/deformity Abdomen: soft, NT, no bruising, no hepatosplenomegaly GU: no blood at urethral meatus of penis, no scrotal masses or abnormality  Back: no wounds, no thoracic/lumbar spine tenderness to palpation, no thoracic/lumbar spine stepoffs Rectal: deferred Extremities: 2+ radial and pedal pulses bilaterally, intact motor and sensation bilateral UE and LE, no peripheral edema MSK: normal gait/station, no clubbing/cyanosis of fingers/toes, normal ROM of all four extremities Skin: warm, dry, no rashes Psych: normal memory, normal mood/affect     Assessment/Plan: [name] with scalp masses x2. Plan for excision today. Informed consent was obtained after detailed explanation of risks, including bleeding, infection, hematoma/seroma, dehiscence, poor cosmesis, temporary or permanent neuropathy, recurrence. All questions answered to the patient's satisfaction.   AJesusita Oka MD General and [facility]

## 2021-10-25 NOTE — Transfer of Care (Signed)
Immediate Anesthesia Transfer of Care Note  Patient: Anthony Mills  Procedure(s) Performed: EXCISION OF SCALP MASSES (Scalp)  Patient Location: PACU  Anesthesia Type:General  Level of Consciousness: drowsy and patient cooperative  Airway & Oxygen Therapy: Patient Spontanous Breathing and Patient connected to nasal cannula oxygen  Post-op Assessment: Report given to RN, Post -op Vital signs reviewed and stable and Patient moving all extremities  Post vital signs: Reviewed and stable  Last Vitals:  Vitals Value Taken Time  BP 135/92 10/25/21 1101  Temp    Pulse 68 10/25/21 1104  Resp 14 10/25/21 1104  SpO2 95 % 10/25/21 1104  Vitals shown include unvalidated device data.  Last Pain:  Vitals:   10/25/21 0648  TempSrc:   PainSc: 0-No pain      Patients Stated Pain Goal: 3 (78/24/23 5361)  Complications: No notable events documented.

## 2021-10-26 ENCOUNTER — Ambulatory Visit: Payer: BC Managed Care – PPO | Admitting: Nurse Practitioner

## 2021-10-26 ENCOUNTER — Encounter (HOSPITAL_COMMUNITY): Payer: Self-pay | Admitting: Surgery

## 2021-10-26 LAB — SURGICAL PATHOLOGY

## 2021-10-30 NOTE — Anesthesia Postprocedure Evaluation (Signed)
Anesthesia Post Note  Patient: Anthony Mills  Procedure(s) Performed: EXCISION OF SCALP MASSES (Scalp)     Patient location during evaluation: PACU Anesthesia Type: General Level of consciousness: awake and alert Pain management: pain level controlled Vital Signs Assessment: post-procedure vital signs reviewed and stable Respiratory status: spontaneous breathing, nonlabored ventilation, respiratory function stable and patient connected to nasal cannula oxygen Cardiovascular status: blood pressure returned to baseline and stable Postop Assessment: no apparent nausea or vomiting Anesthetic complications: no   No notable events documented.  Last Vitals:  Vitals:   10/25/21 1130 10/25/21 1145  BP: 130/89 (!) 123/93  Pulse: 63 60  Resp: 12 14  Temp:  36.7 C  SpO2: 94% 95%    Last Pain:  Vitals:   10/25/21 1145  TempSrc:   PainSc: 0-No pain                 Pernell Lenoir S

## 2021-11-09 DIAGNOSIS — M25511 Pain in right shoulder: Secondary | ICD-10-CM | POA: Diagnosis not present

## 2021-11-09 DIAGNOSIS — G8929 Other chronic pain: Secondary | ICD-10-CM | POA: Diagnosis not present

## 2021-11-09 DIAGNOSIS — M75101 Unspecified rotator cuff tear or rupture of right shoulder, not specified as traumatic: Secondary | ICD-10-CM | POA: Diagnosis not present

## 2021-11-09 DIAGNOSIS — M778 Other enthesopathies, not elsewhere classified: Secondary | ICD-10-CM | POA: Diagnosis not present

## 2021-11-16 DIAGNOSIS — M75101 Unspecified rotator cuff tear or rupture of right shoulder, not specified as traumatic: Secondary | ICD-10-CM | POA: Diagnosis not present

## 2021-11-30 ENCOUNTER — Encounter: Payer: Self-pay | Admitting: Nurse Practitioner

## 2021-11-30 ENCOUNTER — Ambulatory Visit: Payer: BC Managed Care – PPO | Admitting: Nurse Practitioner

## 2021-11-30 VITALS — BP 120/80 | HR 72 | Temp 97.6°F | Ht 72.0 in | Wt 199.4 lb

## 2021-11-30 DIAGNOSIS — N529 Male erectile dysfunction, unspecified: Secondary | ICD-10-CM

## 2021-11-30 DIAGNOSIS — E7800 Pure hypercholesterolemia, unspecified: Secondary | ICD-10-CM

## 2021-11-30 DIAGNOSIS — Z23 Encounter for immunization: Secondary | ICD-10-CM

## 2021-11-30 DIAGNOSIS — R7303 Prediabetes: Secondary | ICD-10-CM

## 2021-11-30 DIAGNOSIS — N401 Enlarged prostate with lower urinary tract symptoms: Secondary | ICD-10-CM

## 2021-11-30 DIAGNOSIS — R3915 Urgency of urination: Secondary | ICD-10-CM

## 2021-11-30 MED ORDER — TADALAFIL 10 MG PO TABS: 10.0000 mg | ORAL_TABLET | Freq: Every day | ORAL | 5 refills | Status: DC

## 2021-11-30 NOTE — Patient Instructions (Signed)
Increase cialis to '10mg'$  daily Schedule appt with [facility] Schedule fasting lab appt in 35month

## 2021-11-30 NOTE — Assessment & Plan Note (Signed)
No improvement with cialis '5mg'$  daily  Increased dose to '10mg'$  daily Advised to schedule appt with urology

## 2021-11-30 NOTE — Assessment & Plan Note (Signed)
>>  ASSESSMENT AND PLAN FOR PREDIABETES WRITTEN ON 11/30/2021  3:01 PM BY Belle Charlie LUM, NP  Repeat hgbA1c

## 2021-11-30 NOTE — Assessment & Plan Note (Signed)
Repeat hgbA1c 

## 2021-11-30 NOTE — Assessment & Plan Note (Signed)
Repeat lipid panel ?

## 2021-11-30 NOTE — Progress Notes (Signed)
Established Patient Visit  Patient: Anthony Mills   DOB: 12-25-1960   61 y.o. Male  MRN: [id number] Visit Date: 11/30/2021  Subjective:    Chief Complaint  Patient presents with   office visit    F/UHyperlipidemia, Benign Prostatic Hyperplasia    HPI Benign prostatic hyperplasia (BPH) with urinary urgency No improvement with cialis '5mg'$  daily  Increased dose to '10mg'$  daily Advised to schedule appt with urology  Prediabetes Repeat hgbA1c  Elevated LDL cholesterol level Repeat lipid panel  Reviewed medical, surgical, and social history today  Medications: Outpatient Medications Prior to Visit  Medication Sig   acetaminophen (TYLENOL) 500 MG tablet Take 2 tablets (1,000 mg total) by mouth 4 (four) times daily.   amoxicillin (AMOXIL) 500 MG capsule Take 500 mg by mouth 2 (two) times daily.   docusate sodium (COLACE) 100 MG capsule Take 1 capsule (100 mg total) by mouth 2 (two) times daily.   methocarbamol (ROBAXIN-750) 750 MG tablet Take 1 tablet (750 mg total) by mouth 4 (four) times daily.   naproxen (NAPROSYN) 500 MG tablet Take 500 mg by mouth 2 (two) times daily.   rosuvastatin (CRESTOR) 20 MG tablet Take 1 tablet (20 mg total) by mouth daily.   [DISCONTINUED] tadalafil (CIALIS) 5 MG tablet Take 1 tablet (5 mg total) by mouth daily. (Patient taking differently: Take 5 mg by mouth 2 (two) times a week.)   [DISCONTINUED] ibuprofen (ADVIL) 600 MG tablet Take 1 tablet (600 mg total) by mouth 4 (four) times daily. (Patient not taking: Reported on 11/30/2021)   [DISCONTINUED] Lifitegrast (XIIDRA) 5 % SOLN Place 1 drop into both eyes 2 (two) times daily as needed (dry eyes). (Patient not taking: Reported on 11/30/2021)   [DISCONTINUED] Naphazoline-Pheniramine (OPCON-A) 0.027-0.315 % SOLN Place 1 drop into both eyes daily as needed (dry eyes). (Patient not taking: Reported on 11/30/2021)   [DISCONTINUED] oxyCODONE (ROXICODONE) 5 MG immediate release tablet Take 1 tablet  (5 mg total) by mouth every 4 (four) hours as needed for severe pain. (Patient not taking: Reported on 11/30/2021)   No facility-administered medications prior to visit.   Reviewed past medical and social history.   ROS per HPI above      Objective:  BP 120/80   Pulse 72   Temp 97.6 F (36.4 C) (Temporal)   Ht 6' (1.829 m)   Wt 199 lb 6.4 oz (90.4 kg)   SpO2 97%   BMI 27.04 kg/m      Physical Exam Vitals reviewed.  Cardiovascular:     Rate and Rhythm: Normal rate.     Pulses: Normal pulses.  Pulmonary:     Effort: Pulmonary effort is normal.  Musculoskeletal:     Right lower leg: No edema.     Left lower leg: No edema.  Neurological:     Mental Status: He is alert and oriented to person, place, and time.     No results found for any visits on 11/30/21.    Assessment & Plan:    Problem List Items Addressed This Visit       Other   Benign prostatic hyperplasia (BPH) with urinary urgency    No improvement with cialis '5mg'$  daily  Increased dose to '10mg'$  daily Advised to schedule appt with urology      Relevant Medications   tadalafil (CIALIS) 10 MG tablet   Elevated LDL cholesterol level    Repeat lipid panel  Relevant Orders   Lipid panel   Erectile dysfunction - Primary   Relevant Medications   tadalafil (CIALIS) 10 MG tablet   Prediabetes    Repeat hgbA1c      Relevant Orders   Hemoglobin A1c   Other Visit Diagnoses     Need for immunization against influenza       Relevant Orders   Flu Vaccine QUAD 6+ mos PF IM (Fluarix Quad PF) (Completed)      Return in about 3 months (around 03/02/2022) for CPE (fasting).     Wilfred Lacy, NP

## 2021-12-17 ENCOUNTER — Telehealth: Payer: Self-pay | Admitting: Nurse Practitioner

## 2021-12-17 NOTE — Telephone Encounter (Signed)
Pt called and stated can you call the neuro doctor and then call him because neuro doctor have them on hold to long

## 2021-12-28 ENCOUNTER — Other Ambulatory Visit: Payer: BC Managed Care – PPO

## 2022-01-04 ENCOUNTER — Other Ambulatory Visit: Payer: BC Managed Care – PPO

## 2022-01-09 ENCOUNTER — Telehealth: Payer: Self-pay | Admitting: Nurse Practitioner

## 2022-01-09 ENCOUNTER — Other Ambulatory Visit (INDEPENDENT_AMBULATORY_CARE_PROVIDER_SITE_OTHER): Payer: BC Managed Care – PPO

## 2022-01-09 DIAGNOSIS — R7303 Prediabetes: Secondary | ICD-10-CM | POA: Diagnosis not present

## 2022-01-09 DIAGNOSIS — E1165 Type 2 diabetes mellitus with hyperglycemia: Secondary | ICD-10-CM

## 2022-01-09 DIAGNOSIS — E7800 Pure hypercholesterolemia, unspecified: Secondary | ICD-10-CM

## 2022-01-09 LAB — LIPID PANEL
Cholesterol: 155 mg/dL (ref 0–200)
HDL: 53.5 mg/dL (ref >39.00)
LDL Cholesterol: 85 mg/dL (ref 0–99)
NonHDL: 101.7
Total CHOL/HDL Ratio: 3
Triglycerides: 84 mg/dL (ref 0.0–149.0)
VLDL: 16.8 mg/dL (ref 0.0–40.0)

## 2022-01-09 LAB — HEMOGLOBIN A1C: Hgb A1c MFr Bld: 6.6 % — ABNORMAL HIGH (ref 4.6–6.5)

## 2022-01-09 NOTE — Telephone Encounter (Signed)
Caller Name: Sheikh Leverich Call back phone #: [phone]  Reason for Call: Pt believes she was supposed to receive a referral for Neurology. I have given him the information of the two active referrals that I do see, however he still believes that there is something missing. Please call

## 2022-01-11 NOTE — Telephone Encounter (Signed)
Unable to reach pt, lvm for pt to call back to schedule an appointment to discuss referral to neurology.

## 2022-01-21 NOTE — Addendum Note (Signed)
Addended by: Wilfred Lacy L on: 01/21/2022 03:59 PM   Modules accepted: Orders

## 2022-01-23 ENCOUNTER — Encounter: Payer: BC Managed Care – PPO | Attending: Nurse Practitioner | Admitting: Skilled Nursing Facility1

## 2022-01-23 ENCOUNTER — Encounter: Payer: Self-pay | Admitting: Skilled Nursing Facility1

## 2022-01-23 VITALS — Wt 200.0 lb

## 2022-01-23 DIAGNOSIS — E119 Type 2 diabetes mellitus without complications: Secondary | ICD-10-CM

## 2022-01-23 NOTE — Progress Notes (Unsigned)
Pt states he eats frozen meals every day before he goes into work. Pt states he might want to start trying Hello Fresh for meals. Pt states he does not eat meat products only seafood. Pt states he gets about 6-7 hours sleep/night. 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 Pt states he wants to loss weight if possible. Pt states he wants to start exercising again before or after work and possibly signing up for a trainer 1 day/week.   A1C: 6.6%.   Goals: Adding in exercise before or after work. Decreasing the amount of frozen meals pt is eating (possibly adding in Hello Fresh). Adding in better snack options for when he is working (dried edamame, fruit, nuts, homemade peanut butter crackers) Drinking only half of the naked smoothies per day OR watering them down OR making own smoothies at home with [place] yogurt.  Diabetes Self-Management Education  Visit Type: First/Initial  Appt. Start Time: 2:00 pm Appt. End Time: 3:00 pm  01/23/2022  Mr. Valeda Malm, identified by name and date of birth, is a 61 y.o. male with a diagnosis of Diabetes: Type 2.   ASSESSMENT  Weight 200 lb (90.7 kg). Body mass index is 27.12 kg/m.   Diabetes Self-Management Education - 01/23/22 1400       Visit Information   Visit Type First/Initial      Initial Visit   Diabetes Type Type 2    Are you currently following a meal plan? No    Are you taking your medications as prescribed? Not on Medications      Health Coping   How would you rate your overall health? Fair      Psychosocial Assessment   Patient Belief/Attitude about Diabetes Motivated to manage diabetes    What is the hardest part about your diabetes right now, causing you the most concern, or is the most worrisome to you about your diabetes?   Making healty food and beverage choices;Being active    Self-care barriers None    Self-management support Friends    Other persons present  Patient    Patient Concerns Nutrition/Meal planning;Healthy Lifestyle;Weight Control    Special Needs None    Preferred Learning Style No preference indicated    Learning Readiness Ready    How often do you need to have someone help you when you read instructions, pamphlets, or other written materials from your doctor or pharmacy? 1 - Never    What is the last grade level you completed in school? 1 year of college      Pre-Education Assessment   Patient understands the diabetes disease and treatment process. Needs Instruction    Patient understands incorporating nutritional management into lifestyle. Needs Instruction    Patient undertands incorporating physical activity into lifestyle. Needs Instruction    Patient understands using medications safely. Needs Instruction    Patient understands monitoring blood glucose, interpreting and using results Needs Instruction    Patient understands prevention, detection, and treatment of acute complications. Needs Instruction    Patient understands prevention, detection, and treatment of chronic complications. Needs Instruction    Patient understands how to develop strategies to address psychosocial issues. Needs Instruction    Patient understands how to develop strategies to promote health/change behavior. Needs Instruction      Complications   Last HgB A1C per patient/outside source 6.6 %    How often do you check your blood sugar? 0 times/day (not testing)  Number of hypoglycemic episodes per month 0    Number of hyperglycemic episodes ( >'200mg'$ /dL): Never    Have you had a dilated eye exam in the past 12 months? Yes    Have you had a dental exam in the past 12 months? Yes    Are you checking your feet? No      Dietary Intake   Breakfast grits + eggs OR cereal + milk (8 am)    Snack (afternoon) Chips, cheese crackers, OR peanut butter crackers (sometimes a candy bar).    Dinner Healthy Choice meals (4 pm)    Beverage(s) water, Gatorade 0,  coffee (sometimes), naked smoothies      Activity / Exercise   Activity / Exercise Type ADL's    How many days per week do you exercise? 0    How many minutes per day do you exercise? 0    Total minutes per week of exercise 0      Patient Education   Previous Diabetes Education No      Individualized Goals (developed by patient)   Nutrition General guidelines for healthy choices and portions discussed;Follow meal plan discussed    Physical Activity Not Applicable    Medications Not Applicable    Monitoring  Not Applicable    Problem Solving Eating Pattern;Sleep Pattern    Reducing Risk do foot checks daily    Health Coping Not Applicable      Post-Education Assessment   Patient understands the diabetes disease and treatment process. Demonstrates understanding / competency    Patient understands incorporating nutritional management into lifestyle. Demonstrates understanding / competency    Patient undertands incorporating physical activity into lifestyle. Demonstrates understanding / competency    Patient understands using medications safely. Demonstrates understanding / competency    Patient understands monitoring blood glucose, interpreting and using results Demonstrates understanding / competency    Patient understands prevention, detection, and treatment of acute complications. Demonstrates understanding / competency    Patient understands prevention, detection, and treatment of chronic complications. Demonstrates understanding / competency    Patient understands how to develop strategies to address psychosocial issues. Demonstrates understanding / competency    Patient understands how to develop strategies to promote health/change behavior. Demonstrates understanding / competency      Outcomes   Expected Outcomes Demonstrated interest in learning. Expect positive outcomes    Future DMSE 3-4 months    Program Status Completed             Individualized Plan for Diabetes  Self-Management Training:   Learning Objective:  Patient will have a greater understanding of diabetes self-management. Patient education plan is to attend individual and/or group sessions per assessed needs and concerns.   Expected Outcomes:  Demonstrated interest in learning. Expect positive outcomes  Education material provided: ADA - How to Thrive: A Guide for Your Journey with Diabetes, Food label handouts, Meal plan card, My Plate, and Snack sheet  If problems or questions, patient to contact team via:  Phone and Email  Future DSME appointment: 3-4 months

## 2022-02-13 ENCOUNTER — Ambulatory Visit (INDEPENDENT_AMBULATORY_CARE_PROVIDER_SITE_OTHER): Payer: BC Managed Care – PPO | Admitting: Nurse Practitioner

## 2022-02-13 ENCOUNTER — Encounter: Payer: Self-pay | Admitting: Nurse Practitioner

## 2022-02-13 VITALS — BP 118/78 | HR 79 | Temp 98.4°F | Ht 72.0 in | Wt 207.4 lb

## 2022-02-13 DIAGNOSIS — E1165 Type 2 diabetes mellitus with hyperglycemia: Secondary | ICD-10-CM | POA: Diagnosis not present

## 2022-02-13 DIAGNOSIS — R400 Somnolence: Secondary | ICD-10-CM

## 2022-02-13 DIAGNOSIS — R519 Headache, unspecified: Secondary | ICD-10-CM | POA: Diagnosis not present

## 2022-02-13 LAB — BASIC METABOLIC PANEL WITH GFR
BUN: 9 mg/dL (ref 6–23)
CO2: 29 meq/L (ref 19–32)
Calcium: 9.2 mg/dL (ref 8.4–10.5)
Chloride: 104 meq/L (ref 96–112)
Creatinine, Ser: 1.19 mg/dL (ref 0.40–1.50)
GFR: 65.95 mL/min
Glucose, Bld: 126 mg/dL — ABNORMAL HIGH (ref 70–99)
Potassium: 4.1 meq/L (ref 3.5–5.1)
Sodium: 141 meq/L (ref 135–145)

## 2022-02-13 LAB — CBC
HCT: 43.8 % (ref 39.0–52.0)
Hemoglobin: 14.6 g/dL (ref 13.0–17.0)
MCHC: 33.4 g/dL (ref 30.0–36.0)
MCV: 83.8 fl (ref 78.0–100.0)
Platelets: 189 10*3/uL (ref 150.0–400.0)
RBC: 5.23 Mil/uL (ref 4.22–5.81)
RDW: 13.6 % (ref 11.5–15.5)
WBC: 3.2 10*3/uL — ABNORMAL LOW (ref 4.0–10.5)

## 2022-02-13 LAB — SEDIMENTATION RATE: Sed Rate: 9 mm/hr (ref 0–20)

## 2022-02-13 LAB — C-REACTIVE PROTEIN: CRP: <1 mg/dL (ref 0.5–20.0)

## 2022-02-13 LAB — MICROALBUMIN / CREATININE URINE RATIO
Creatinine,U: 138.3 mg/dL
Microalb Creat Ratio: 0.5 mg/g (ref 0.0–30.0)
Microalb, Ur: <0.7 mg/dL (ref 0.0–1.9)

## 2022-02-13 NOTE — Patient Instructions (Addendum)
Go to lab Use tylenol '325mg'$  1-2tabs every 8hrs as needed You will be contacted to schedule appt with sleep clinic

## 2022-02-13 NOTE — Progress Notes (Signed)
Acute Office Visit  Subjective:    Patient ID: Anthony Mills, male    DOB: [birth year], 61 y.o.   MRN: [id number]  Chief Complaint  Patient presents with  . Acute Visit    C/o headaches x 3 weeks  No other concerns     HPI Headache Hx of intermittent headaches, but Constant and dull in last 3weeks. He works 7pm to [employer] for last 77yr, started to experience worsening headache and somnolence about 159mnight daily in last 3weeks. Sleep 7hrs each night, denies snoring or reported apneic episodes.  Normal neuro exam today, normal BP Advise to use tylenol or ibuprofen for headache Ref to sleep clinic  BP Readings from Last 3 Encounters:  02/13/22 118/78  11/30/21 120/80  10/25/21 (!) 123/93    Outpatient Medications Prior to Visit  Medication Sig  . acetaminophen (TYLENOL) 500 MG tablet Take 2 tablets (1,000 mg total) by mouth 4 (four) times daily.  . Marland Kitchenmoxicillin (AMOXIL) 500 MG capsule Take 500 mg by mouth 2 (two) times daily.  . rosuvastatin (CRESTOR) 20 MG tablet Take 1 tablet (20 mg total) by mouth daily.  . tadalafil (CIALIS) 10 MG tablet Take 1 tablet (10 mg total) by mouth daily.  . [DISCONTINUED] docusate sodium (COLACE) 100 MG capsule Take 1 capsule (100 mg total) by mouth 2 (two) times daily. (Patient not taking: Reported on 02/13/2022)  . [DISCONTINUED] methocarbamol (ROBAXIN-750) 750 MG tablet Take 1 tablet (750 mg total) by mouth 4 (four) times daily. (Patient not taking: Reported on 02/13/2022)  . [DISCONTINUED] naproxen (NAPROSYN) 500 MG tablet Take 500 mg by mouth 2 (two) times daily. (Patient not taking: Reported on 02/13/2022)   No facility-administered medications prior to visit.    Reviewed past medical and social history.  Review of Systems  Constitutional:  Negative for appetite change, chills and fever.  HENT:  Negative for sinus pressure and sinus pain.   Eyes:  Negative for visual disturbance.  Cardiovascular: Negative.   Musculoskeletal: Negative.    Hematological: Negative.   Psychiatric/Behavioral: Negative.       CBC    Component Value Date/Time   WBC 5.9 09/28/2021 1121   RBC 5.25 09/28/2021 1121   HGB 14.9 09/28/2021 1121   HCT 46.4 09/28/2021 1121   PLT 188.0 09/28/2021 1121   MCV 88.3 09/28/2021 1121   MCH 26.4 10/25/2018 1334   MCHC 32.1 09/28/2021 1121   RDW 13.8 09/28/2021 1121   LYMPHSABS 1.0 10/25/2018 1334   MONOABS 0.7 10/25/2018 1334   EOSABS 0.1 10/25/2018 1334   BASOSABS 0.1 10/25/2018 1334    CMP     Component Value Date/Time   NA 143 10/25/2018 1334   K 4.0 10/25/2018 1334   CL 108 10/25/2018 1334   CO2 24 10/25/2018 1334   GLUCOSE 121 (H) 10/25/2018 1334   BUN 10 10/25/2018 1334   CREATININE 1.09 10/25/2018 1334   CALCIUM 9.4 10/25/2018 1334   GFRNONAA >60 10/25/2018 1334   GFRAA >60 10/25/2018 1334      Objective:    Physical Exam Eyes:     Extraocular Movements: Extraocular movements intact.     Conjunctiva/sclera: Conjunctivae normal.     Pupils: Pupils are equal, round, and reactive to light.  Cardiovascular:     Rate and Rhythm: Normal rate.     Pulses: Normal pulses.  Pulmonary:     Effort: Pulmonary effort is normal.  Neurological:     Mental Status: He is alert and oriented to person,  place, and time.     Cranial Nerves: No cranial nerve deficit.  Psychiatric:        Mood and Affect: Mood normal.        Behavior: Behavior normal.        Thought Content: Thought content normal.    BP 118/78 (BP Location: Right Arm, Patient Position: Sitting, Cuff Size: Normal)   Pulse 79   Temp 98.4 F (36.9 C) (Temporal)   Ht 6' (1.829 m)   Wt 207 lb 6.4 oz (94.1 kg)   SpO2 97%   BMI 28.13 kg/m  BP Readings from Last 3 Encounters:  02/13/22 118/78  11/30/21 120/80  10/25/21 (!) 123/93   Wt Readings from Last 3 Encounters:  02/13/22 207 lb 6.4 oz (94.1 kg)  01/23/22 200 lb (90.7 kg)  11/30/21 199 lb 6.4 oz (90.4 kg)   No results found for any visits on 02/13/22.       Assessment & Plan:   Problem List Items Addressed This Visit       Other   Headache - Primary    Hx of intermittent headaches, but Constant and dull in last 3weeks. He works 7pm to [employer] for last 18yr, started to experience worsening headache and somnolence about 140mnight daily in last 3weeks. Sleep 7hrs each night, denies snoring or reported apneic episodes.  Normal neuro exam today, normal BP Advise to use tylenol or ibuprofen for headache Ref to sleep clinic      Other Visit Diagnoses     Daytime somnolence       Relevant Orders   CBC   C-reactive protein   Basic metabolic panel   Ambulatory referral to Sleep Studies   Type 2 diabetes mellitus with hyperglycemia, without long-term current use of insulin (HCC)       Relevant Orders   Microalbumin / creatinine urine ratio      No orders of the defined types were placed in this encounter.  Return in about 3 months (around 05/15/2022) for DM, hyperlipidemia (fasting).    ChWilfred LacyNP

## 2022-02-13 NOTE — Assessment & Plan Note (Addendum)
Hx of intermittent headaches, but Constant and dull in last 3weeks. He works 7pm to [employer] for last 60yr, started to experience worsening headache and somnolence about 173mnight daily in last 3weeks. Sleep 7hrs each night, denies snoring or reported apneic episodes.  Normal neuro exam today, normal BP Advise to use tylenol or ibuprofen for headache Ref to sleep clinic

## 2022-02-15 DIAGNOSIS — R3916 Straining to void: Secondary | ICD-10-CM | POA: Diagnosis not present

## 2022-02-15 DIAGNOSIS — F5221 Male erectile disorder: Secondary | ICD-10-CM | POA: Diagnosis not present

## 2022-02-15 DIAGNOSIS — R3914 Feeling of incomplete bladder emptying: Secondary | ICD-10-CM | POA: Diagnosis not present

## 2022-03-08 ENCOUNTER — Encounter: Payer: BC Managed Care – PPO | Admitting: Nurse Practitioner

## 2022-03-29 DIAGNOSIS — B078 Other viral warts: Secondary | ICD-10-CM | POA: Diagnosis not present

## 2022-03-29 DIAGNOSIS — I872 Venous insufficiency (chronic) (peripheral): Secondary | ICD-10-CM | POA: Diagnosis not present

## 2022-04-26 DIAGNOSIS — L905 Scar conditions and fibrosis of skin: Secondary | ICD-10-CM | POA: Diagnosis not present

## 2022-05-03 ENCOUNTER — Ambulatory Visit (INDEPENDENT_AMBULATORY_CARE_PROVIDER_SITE_OTHER): Payer: BC Managed Care – PPO | Admitting: Nurse Practitioner

## 2022-05-03 ENCOUNTER — Encounter: Payer: Self-pay | Admitting: Nurse Practitioner

## 2022-05-03 VITALS — BP 109/80 | HR 60 | Temp 98.0°F | Resp 16 | Ht 71.0 in | Wt 201.8 lb

## 2022-05-03 DIAGNOSIS — E7800 Pure hypercholesterolemia, unspecified: Secondary | ICD-10-CM

## 2022-05-03 DIAGNOSIS — E1169 Type 2 diabetes mellitus with other specified complication: Secondary | ICD-10-CM | POA: Diagnosis not present

## 2022-05-03 DIAGNOSIS — Z1211 Encounter for screening for malignant neoplasm of colon: Secondary | ICD-10-CM | POA: Diagnosis not present

## 2022-05-03 DIAGNOSIS — N401 Enlarged prostate with lower urinary tract symptoms: Secondary | ICD-10-CM | POA: Diagnosis not present

## 2022-05-03 DIAGNOSIS — R3915 Urgency of urination: Secondary | ICD-10-CM | POA: Diagnosis not present

## 2022-05-03 DIAGNOSIS — Z0001 Encounter for general adult medical examination with abnormal findings: Secondary | ICD-10-CM | POA: Diagnosis not present

## 2022-05-03 DIAGNOSIS — E1165 Type 2 diabetes mellitus with hyperglycemia: Secondary | ICD-10-CM

## 2022-05-03 DIAGNOSIS — N529 Male erectile dysfunction, unspecified: Secondary | ICD-10-CM

## 2022-05-03 DIAGNOSIS — E785 Hyperlipidemia, unspecified: Secondary | ICD-10-CM

## 2022-05-03 DIAGNOSIS — Z0000 Encounter for general adult medical examination without abnormal findings: Secondary | ICD-10-CM

## 2022-05-03 LAB — HEPATIC FUNCTION PANEL
ALT: 13 U/L (ref 0–53)
AST: 19 U/L (ref 0–37)
Albumin: 4.3 g/dL (ref 3.5–5.2)
Alkaline Phosphatase: 65 U/L (ref 39–117)
Bilirubin, Direct: 0.2 mg/dL (ref 0.0–0.3)
Total Bilirubin: 0.8 mg/dL (ref 0.2–1.2)
Total Protein: 7.4 g/dL (ref 6.0–8.3)

## 2022-05-03 LAB — HEMOGLOBIN A1C: Hgb A1c MFr Bld: 6.4 % (ref 4.6–6.5)

## 2022-05-03 LAB — PSA: PSA: 1.21 ng/mL (ref 0.10–4.00)

## 2022-05-03 NOTE — Assessment & Plan Note (Signed)
LDL at goal with crestor ?

## 2022-05-03 NOTE — Assessment & Plan Note (Addendum)
Diet controlled Normal foot exam No nephropathy No neuropathy Advised to schedule appt with dentist and ophthalmology BP at goal LDL at goal with crestor  Repeat hgbA1c today

## 2022-05-03 NOTE — Progress Notes (Signed)
Complete physical exam  Patient: Anthony Mills   DOB: Jul 21, 1960   62 y.o. Male  MRN: [id number] Visit Date: 05/03/2022  Subjective:    Chief Complaint  Patient presents with   Annual Exam    Fasting- Yes -  Refill of Crestor   Anthony Mills is a 62 y.o. male who presents today for a complete physical exam. He reports consuming a general diet.  No exercise  He generally feels well. He reports sleeping well. He does not have additional problems to discuss today.  Vision:No Dental:No STD Screen:NO PSA:Yes  BP Readings from Last 3 Encounters:  05/03/22 109/80  02/13/22 118/78  11/30/21 120/80   Wt Readings from Last 3 Encounters:  05/03/22 201 lb 12.8 oz (91.5 kg)  02/13/22 207 lb 6.4 oz (94.1 kg)  01/23/22 200 lb (90.7 kg)   Most recent fall risk assessment:    05/03/2022    8:24 AM  [place] in the past year? 0  Number falls in past yr: 0  Injury with Fall? 0  Risk for fall due to : No Fall Risks  Follow up Falls evaluation completed   Depression screen:Yes - No Depression  Most recent depression screenings:    09/28/2021   11:22 AM  PHQ 2/9 Scores  PHQ - 2 Score 0  PHQ- 9 Score 3   HPI  Type 2 diabetes mellitus with hyperglycemia, without long-term current use of insulin (HCC) Diet controlled Normal foot exam No nephropathy No neuropathy Advised to schedule appt with dentist and ophthalmology BP at goal LDL at goal with crestor  Repeat hgbA1c today  Hyperlipidemia associated with type 2 diabetes mellitus ([place]) LDL at goal with crestor  Benign prostatic hyperplasia (BPH) with urinary urgency improved with use of flomax Under the care of urology Repeat PSA today  Past Medical History:  Diagnosis Date   Allergy    Bell's palsy    Bell's palsy [id number]   Complication of anesthesia    hard to wake up   Diabetes mellitus without complication ([place])    Hyperlipidemia    Internal hemorrhoids    Testicular pain 09/23/2018   Past Surgical  History:  Procedure Laterality Date   CIRCUMCISION     COLONOSCOPY     HERNIA REPAIR     KNEE SURGERY Left    left wrist surgery     MASS EXCISION N/A 10/25/2021   Procedure: EXCISION OF SCALP MASSES;  Surgeon: Jesusita Oka, MD;  Location: [facility] OR;  Service: General;  Laterality: N/A;   Social History   Socioeconomic History   Marital status: Divorced    Spouse name: Not on file   Number of children: Not on file   Years of education: Not on file   Highest education level: Not on file  Occupational History   Not on file  Tobacco Use   Smoking status: Never   Smokeless tobacco: Never  Vaping Use   Vaping Use: Never used  Substance and Sexual Activity   Alcohol use: No   Drug use: No   Sexual activity: Yes  Other Topics Concern   Not on file  Social History Narrative   Not on file   Social Determinants of Health   Financial Resource Strain: Not on file  Food Insecurity: Not on file  Transportation Needs: Not on file  Physical Activity: Not on file  Stress: Not on file  Social Connections: Not on file  Intimate Partner Violence:  Not on file   Family Status  Relation Name Status   Nephew  (Not Specified)   Mother  (Not Specified)   Father  (Not Specified)   Sister  (Not Specified)   Neg Hx  (Not Specified)   Family History  Problem Relation Age of Onset   Asthma Nephew    Breast cancer Mother    Prostate cancer Father    Breast cancer Sister    Colon cancer Neg Hx    Esophageal cancer Neg Hx    Liver cancer Neg Hx    Pancreatic cancer Neg Hx    Rectal cancer Neg Hx    Stomach cancer Neg Hx    Colon polyps Neg Hx    Allergies  Allergen Reactions   Codeine Other (See Comments)    hallucinate    Patient Care Team: Renella Steig, Charlene Brooke, NP as PCP - General (Internal Medicine)   Medications: Outpatient Medications Prior to Visit  Medication Sig   rosuvastatin (CRESTOR) 20 MG tablet Take 1 tablet (20 mg total) by mouth daily.   tadalafil (CIALIS) 10  MG tablet Take 1 tablet (10 mg total) by mouth daily.   tamsulosin (FLOMAX) 0.4 MG CAPS capsule Take 0.4 mg by mouth daily.   [DISCONTINUED] acetaminophen (TYLENOL) 500 MG tablet Take 2 tablets (1,000 mg total) by mouth 4 (four) times daily. (Patient not taking: Reported on 05/03/2022)   [DISCONTINUED] amoxicillin (AMOXIL) 500 MG capsule Take 500 mg by mouth 2 (two) times daily. (Patient not taking: Reported on 05/03/2022)   No facility-administered medications prior to visit.   Review of Systems  Constitutional:  Negative for activity change, appetite change and unexpected weight change.  Respiratory: Negative.    Cardiovascular: Negative.   Gastrointestinal: Negative.   Endocrine: Negative for cold intolerance and heat intolerance.  Genitourinary: Negative.   Musculoskeletal: Negative.   Skin: Negative.   Neurological: Negative.   Hematological: Negative.   Psychiatric/Behavioral:  Negative for behavioral problems, decreased concentration, dysphoric mood, hallucinations, self-injury, sleep disturbance and suicidal ideas. The patient is not nervous/anxious.    Last metabolic panel Lab Results  Component Value Date   GLUCOSE 126 (H) 02/13/2022   NA 141 02/13/2022   K 4.1 02/13/2022   CL 104 02/13/2022   CO2 29 02/13/2022   BUN 9 02/13/2022   CREATININE 1.19 02/13/2022   GFRNONAA >60 10/25/2018   CALCIUM 9.2 02/13/2022   PROT 7.4 05/03/2022   ALBUMIN 4.3 05/03/2022   BILITOT 0.8 05/03/2022   ALKPHOS 65 05/03/2022   AST 19 05/03/2022   ALT 13 05/03/2022   ANIONGAP 11 10/25/2018   Last lipids Lab Results  Component Value Date   CHOL 155 01/09/2022   HDL 53.50 01/09/2022   LDLCALC 85 01/09/2022   TRIG 84.0 01/09/2022   CHOLHDL 3 01/09/2022   Last hemoglobin A1c Lab Results  Component Value Date   HGBA1C 6.4 05/03/2022        Objective:  BP 109/80 (BP Location: Left Arm, Patient Position: Sitting, Cuff Size: Large)   Pulse 60   Temp 98 F (36.7 C) (Temporal)   Resp  16   Ht '5\' 11"'$  (1.803 m)   Wt 201 lb 12.8 oz (91.5 kg)   SpO2 99%   BMI 28.15 kg/m     Physical Exam Vitals and nursing note reviewed.  Constitutional:      General: He is not in acute distress. HENT:     Right Ear: Tympanic membrane, ear canal and external ear  normal.     Left Ear: Tympanic membrane, ear canal and external ear normal.     Nose: Nose normal.  Eyes:     Extraocular Movements: Extraocular movements intact.     Conjunctiva/sclera: Conjunctivae normal.     Pupils: Pupils are equal, round, and reactive to light.  Neck:     Thyroid: No thyroid mass, thyromegaly or thyroid tenderness.  Cardiovascular:     Rate and Rhythm: Normal rate and regular rhythm.     Pulses: Normal pulses.     Heart sounds: Normal heart sounds.  Pulmonary:     Effort: Pulmonary effort is normal.     Breath sounds: Normal breath sounds.  Abdominal:     General: Bowel sounds are normal.     Palpations: Abdomen is soft.  Musculoskeletal:        General: Normal range of motion.     Cervical back: Normal range of motion and neck supple.     Right lower leg: No edema.     Left lower leg: No edema.  Lymphadenopathy:     Cervical: No cervical adenopathy.  Skin:    General: Skin is warm and dry.  Neurological:     Mental Status: He is alert and oriented to person, place, and time.     Cranial Nerves: No cranial nerve deficit.  Psychiatric:        Mood and Affect: Mood normal.        Behavior: Behavior normal.        Thought Content: Thought content normal.      Results for orders placed or performed in visit on 05/03/22  Hemoglobin A1c  Result Value Ref Range   Hgb A1c MFr Bld 6.4 4.6 - 6.5 %  PSA  Result Value Ref Range   PSA 1.21 0.10 - 4.00 ng/mL  Hepatic function panel  Result Value Ref Range   Total Bilirubin 0.8 0.2 - 1.2 mg/dL   Bilirubin, Direct 0.2 0.0 - 0.3 mg/dL   Alkaline Phosphatase 65 39 - 117 U/L   AST 19 0 - 37 U/L   ALT 13 0 - 53 U/L   Total Protein 7.4 6.0 -  8.3 g/dL   Albumin 4.3 3.5 - 5.2 g/dL      Assessment & Plan:    Routine Health Maintenance and Physical Exam  Immunization History  Administered Date(s) Administered   Influenza Split 12/10/2019   Influenza,inj,Quad PF,6+ Mos 10/27/2020, 11/30/2021   Influenza,trivalent, recombinat, inj, PF 12/10/2018   Influenza-Unspecified 12/10/2018   Pneumococcal Polysaccharide-23 11/13/2018   Tdap 01/09/2018   Zoster Recombinat (Shingrix) 01/28/2020, 05/19/2020   Health Maintenance  Topic Date Due   OPHTHALMOLOGY EXAM  Never done   COLONOSCOPY (Pts 45-31yr Insurance coverage will need to be confirmed)  03/14/2019   COVID-19 Vaccine (1) 05/18/2022 (Originally 08/25/1965)   HIV Screening  05/02/2023 (Originally 08/26/1975)   Hepatitis C Screening  05/03/2023 (Originally 08/26/1978)   HEMOGLOBIN A1C  11/03/2022   Diabetic kidney evaluation - eGFR measurement  02/14/2023   Diabetic kidney evaluation - Urine ACR  02/14/2023   FOOT EXAM  05/03/2023   DTaP/Tdap/Td (2 - Td or Tdap) 01/10/2028   INFLUENZA VACCINE  Completed   Zoster Vaccines- Shingrix  Completed   HPV VACCINES  Aged Out   Discussed health benefits of physical activity, and encouraged him to engage in regular exercise appropriate for his age and condition.  Problem List Items Addressed This Visit       Endocrine  Hyperlipidemia associated with type 2 diabetes mellitus ([place])    LDL at goal with crestor      Relevant Orders   Hepatic function panel (Completed)   Type 2 diabetes mellitus with hyperglycemia, without long-term current use of insulin (HCC)    Diet controlled Normal foot exam No nephropathy No neuropathy Advised to schedule appt with dentist and ophthalmology BP at goal LDL at goal with crestor  Repeat hgbA1c today      Relevant Orders   Hemoglobin A1c (Completed)     Other   Benign prostatic hyperplasia (BPH) with urinary urgency    improved with use of flomax Under the care of urology Repeat PSA  today      Relevant Orders   PSA (Completed)   Other Visit Diagnoses     Encounter for preventative adult health care exam with abnormal findings    -  Primary   Colon cancer screening       Relevant Orders   Ambulatory referral to Gastroenterology      Return in about 3 months (around 08/03/2022) for DM, hyperlipidemia (fasting).     Wilfred Lacy, NP

## 2022-05-03 NOTE — Assessment & Plan Note (Addendum)
improved with use of flomax Under the care of urology Repeat PSA today

## 2022-05-03 NOTE — Patient Instructions (Addendum)
Schedule appt for repeat colonoscopy: [id number]. Schedule appt with dentist Have eye exam report faxed to me Go to lab

## 2022-05-03 NOTE — Progress Notes (Signed)
Improved hgbA1c : 6.6 to 6.4% Normal PSA but increased from 0.95. Schedule f/up appt with urology Normal liver function

## 2022-05-09 ENCOUNTER — Ambulatory Visit: Payer: BC Managed Care – PPO | Admitting: Skilled Nursing Facility1

## 2022-05-10 DIAGNOSIS — H2513 Age-related nuclear cataract, bilateral: Secondary | ICD-10-CM | POA: Diagnosis not present

## 2022-05-10 DIAGNOSIS — D869 Sarcoidosis, unspecified: Secondary | ICD-10-CM | POA: Diagnosis not present

## 2022-05-10 DIAGNOSIS — H30033 Focal chorioretinal inflammation, peripheral, bilateral: Secondary | ICD-10-CM | POA: Diagnosis not present

## 2022-05-10 DIAGNOSIS — H3581 Retinal edema: Secondary | ICD-10-CM | POA: Diagnosis not present

## 2022-05-10 LAB — HM DIABETES EYE EXAM

## 2022-05-16 DIAGNOSIS — Z9889 Other specified postprocedural states: Secondary | ICD-10-CM | POA: Diagnosis not present

## 2022-05-16 DIAGNOSIS — R202 Paresthesia of skin: Secondary | ICD-10-CM | POA: Diagnosis not present

## 2022-05-17 ENCOUNTER — Ambulatory Visit: Payer: BC Managed Care – PPO | Admitting: Nurse Practitioner

## 2022-06-13 ENCOUNTER — Telehealth: Payer: Self-pay | Admitting: Nurse Practitioner

## 2022-06-13 NOTE — Telephone Encounter (Signed)
Pt want to know is there a charge for him to go to the referral tomorrow that you are sending him

## 2022-06-14 ENCOUNTER — Ambulatory Visit: Payer: BC Managed Care – PPO | Admitting: Dietician

## 2022-06-14 ENCOUNTER — Encounter: Payer: Self-pay | Admitting: Nurse Practitioner

## 2022-06-14 ENCOUNTER — Ambulatory Visit: Payer: BC Managed Care – PPO | Admitting: Nurse Practitioner

## 2022-06-14 VITALS — BP 116/74 | HR 79 | Temp 98.1°F | Wt 206.4 lb

## 2022-06-14 DIAGNOSIS — R519 Headache, unspecified: Secondary | ICD-10-CM | POA: Diagnosis not present

## 2022-06-14 DIAGNOSIS — R202 Paresthesia of skin: Secondary | ICD-10-CM | POA: Insufficient documentation

## 2022-06-14 LAB — C-REACTIVE PROTEIN: CRP: <1 mg/dL (ref 0.5–20.0)

## 2022-06-14 LAB — CBC
HCT: 39.6 % (ref 39.0–52.0)
Hemoglobin: 12.7 g/dL — ABNORMAL LOW (ref 13.0–17.0)
MCHC: 32 g/dL (ref 30.0–36.0)
MCV: 79.7 fl (ref 78.0–100.0)
Platelets: 216 10*3/uL (ref 150.0–400.0)
RBC: 4.96 Mil/uL (ref 4.22–5.81)
RDW: 13.4 % (ref 11.5–15.5)
WBC: 3.7 10*3/uL — ABNORMAL LOW (ref 4.0–10.5)

## 2022-06-14 LAB — SEDIMENTATION RATE: Sed Rate: 26 mm/hr — ABNORMAL HIGH (ref 0–20)

## 2022-06-14 NOTE — Progress Notes (Signed)
Established Patient Visit  Patient: Anthony Mills   DOB: November 05, 1960   62 y.o. Male  MRN: [id number] Visit Date: 06/14/2022  Subjective:    Chief Complaint  Patient presents with   Referral to neurology    Referral [facility] [address] [facility] [address] Fax [phone]    HPI Paresthesia S/p lipoma removal  (right occipital and right temporal region) on 12/31/2021 by [facility] [place] [facility]: Dr. Bedelia Person Today he reports numbness and tingling on right side of scalp x 3months, numbness is constant but tingling is waxing and waning. No change in vision or speech or facila asymmetry. He had another appt with Dr. Bedelia Person 05/16/2022, gabapentin was recommended but he decline to take med. He is requesting for referral to neurology Hx of bell's palsy on left side and chronic headache. No change in headache frequency or characteristics. He declined to take any medication for headache as well. Previous MRI 10/2018:No acute or reversible finding. 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  Normal neuro exam today Check sed rate, CRP and CBC Get CT head Entered referral to neurology   Reviewed medical, surgical, and social history today  Medications: Outpatient Medications Prior to Visit  Medication Sig   rosuvastatin (CRESTOR) 20 MG tablet Take 1 tablet (20 mg total) by mouth daily.   tadalafil (CIALIS) 10 MG tablet Take 1 tablet (10 mg total) by mouth daily.   tamsulosin (FLOMAX) 0.4 MG CAPS capsule Take 0.4 mg by mouth daily. (Patient not taking: Reported on 06/14/2022)   No facility-administered medications prior to visit.   Reviewed past medical and social history.   ROS per HPI above      Objective:  BP 116/74 (BP Location: Left Arm, Patient Position: Sitting, Cuff Size: Large)   Pulse 79   Temp 98.1 F (36.7 C) (Temporal)   Wt 206 lb 6.4 oz (93.6 kg)   SpO2 97%   BMI 28.79 kg/m      Physical  Exam Vitals reviewed.  HENT:     Right Ear: Tympanic membrane, ear canal and external ear normal.     Left Ear: Tympanic membrane, ear canal and external ear normal.  Eyes:     Extraocular Movements: Extraocular movements intact.     Conjunctiva/sclera: Conjunctivae normal.     Pupils: Pupils are equal, round, and reactive to light.  Cardiovascular:     Rate and Rhythm: Normal rate.     Pulses: Normal pulses.  Pulmonary:     Effort: Pulmonary effort is normal.  Musculoskeletal:     Cervical back: Normal range of motion and neck supple.     Right lower leg: No edema.     Left lower leg: No edema.  Lymphadenopathy:     Cervical: No cervical adenopathy.  Skin:    General: Skin is warm and dry.     Findings: No erythema or rash.  Neurological:     Mental Status: He is alert and oriented to person, place, and time.     Cranial Nerves: No cranial nerve deficit.     Motor: No weakness.  Psychiatric:        Mood and Affect: Mood normal.        Behavior: Behavior normal.        Thought Content: Thought content normal.     No results found for any visits on 06/14/22.    Assessment &  Plan:    Problem List Items Addressed This Visit       Other   Paresthesia - Primary    S/p lipoma removal  (right occipital and right temporal region) on 12/31/2021 by [facility] [place] [facility]: Dr. Bedelia Person Today he reports numbness and tingling on right side of scalp x 3months, numbness is constant but tingling is waxing and waning. No change in vision or speech or facila asymmetry. He had another appt with Dr. Bedelia Person 05/16/2022, gabapentin was recommended but he decline to take med. He is requesting for referral to neurology Hx of bell's palsy on left side and chronic headache. No change in headache frequency or characteristics. He declined to take any medication for headache as well. Previous MRI 10/2018:No acute or reversible finding. Nonspecific white matter disease without active/progressive  features since 2019.  Normal neuro exam today Check sed rate, CRP and CBC Get CT head Entered referral to neurology      Relevant Orders   Sedimentation rate   C-reactive protein   CBC   CT HEAD WO CONTRAST ( )   Ambulatory referral to Neurology   Other Visit Diagnoses     Chronic daily headache       Relevant Orders   Sedimentation rate   C-reactive protein   CBC   CT HEAD WO CONTRAST ( )   Ambulatory referral to Neurology      Return if symptoms worsen or fail to improve.     Alysia Penna, NP

## 2022-06-14 NOTE — Assessment & Plan Note (Addendum)
S/p lipoma removal  (right occipital and right temporal region) on 12/31/2021 by [facility] [place] [facility]: Dr. Bedelia Person (reviewed office notes) Today he reports numbness and tingling on right side of scalp x 3months, numbness is constant but tingling is waxing and waning. No change in vision or speech or facila asymmetry. He had another appt with Dr. Bedelia Person 05/16/2022, gabapentin was recommended but he decline to take med. He is requesting for referral to neurology Hx of bell's palsy on left side and chronic headache. No change in headache frequency or characteristics. He declined to take any medication for headache as well. Previous MRI 10/2018:No acute or reversible finding. 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  Normal neuro exam today Check sed rate, CRP and CBC Get CT head Entered referral to neurology

## 2022-06-14 NOTE — Patient Instructions (Addendum)
Go to lab Start gabapentin as prescribed You will be contacted to schedule appt with neurology and for CT head Go to ED if symptoms worsen  Paresthesia Paresthesia is a burning or prickling feeling. This feeling can happen in any part of the body. It often happens in the hands, arms, legs, or feet. Usually, it is not painful. In most cases, the feeling goes away in a short time and is not a sign of a serious problem. If you have paresthesia that lasts a long time, you need to see your doctor. Follow these instructions at home: Nutrition Eat a healthy diet. This includes: Eating foods that are high in fiber. These include beans, whole grains, and fresh fruits and vegetables. Limiting foods that are high in fat and sugar. These include fried or sweet foods.  Alcohol use  Do not drink alcohol if: Your doctor tells you not to drink. You are pregnant, may be pregnant, or are planning to become pregnant. If you drink alcohol: Limit how much you have to: 0-1 drink a day for women. 0-2 drinks a day for men. Know how much alcohol is in your drink. In the [place] one drink equals one 12 oz bottle of beer (355 mL), one 5 oz glass of wine (148 mL), or one 1 oz glass of hard liquor (44 mL). General instructions Take over-the-counter and prescription medicines only as told by your doctor. Do not smoke or use any products that contain nicotine or tobacco. If you need help quitting, ask your doctor. If you have diabetes, work with your doctor to make sure your blood sugar stays in a healthy range. If your feet feel numb: Check for redness, warmth, and swelling every day. Wear padded socks and comfortable shoes. These help protect your feet. Keep all follow-up visits. Contact a doctor if: You have paresthesia that gets worse or does not go away. You lose feeling (have numbness) after an injury. Your burning or prickling feeling gets worse when you walk. You have pain or cramps. You feel dizzy or  you faint. You have a rash. Get help right away if: You feel weak or have new weakness in an arm or leg. You have trouble walking or moving. You have problems speaking, understanding, or seeing. You feel confused. You cannot control when you pee (urinate) or poop (have a bowel movement). These symptoms may be an emergency. Get help right away. Call 911. Do not wait to see if the symptoms will go away. Do not drive yourself to the hospital. Summary Paresthesia is a burning or prickling feeling. It often happens in the hands, arms, legs, or feet. In most cases, the feeling goes away in a short time and is not a sign of a serious problem. If you have paresthesia that lasts a long time, you need to be seen by your doctor. This information is not intended to replace advice given to you by your health care provider. Make sure you discuss any questions you have with your health care provider. Document Revised: 10/23/2020 Document Reviewed: 10/23/2020 Elsevier Patient Education  2023 [employer].

## 2022-06-21 DIAGNOSIS — F5221 Male erectile disorder: Secondary | ICD-10-CM | POA: Diagnosis not present

## 2022-06-21 DIAGNOSIS — D485 Neoplasm of uncertain behavior of skin: Secondary | ICD-10-CM | POA: Diagnosis not present

## 2022-06-21 DIAGNOSIS — R3914 Feeling of incomplete bladder emptying: Secondary | ICD-10-CM | POA: Diagnosis not present

## 2022-07-12 DIAGNOSIS — R030 Elevated blood-pressure reading, without diagnosis of hypertension: Secondary | ICD-10-CM | POA: Diagnosis not present

## 2022-07-12 DIAGNOSIS — S60031A Contusion of right middle finger without damage to nail, initial encounter: Secondary | ICD-10-CM | POA: Diagnosis not present

## 2022-07-19 ENCOUNTER — Ambulatory Visit
Admission: RE | Admit: 2022-07-19 | Discharge: 2022-07-19 | Disposition: A | Discharge status: Home / Self Care | Payer: BC Managed Care – PPO | Source: Ambulatory Visit | Attending: Nurse Practitioner | Admitting: Nurse Practitioner

## 2022-07-19 DIAGNOSIS — R519 Headache, unspecified: Secondary | ICD-10-CM

## 2022-07-19 DIAGNOSIS — J320 Chronic maxillary sinusitis: Secondary | ICD-10-CM | POA: Diagnosis not present

## 2022-07-19 DIAGNOSIS — R202 Paresthesia of skin: Secondary | ICD-10-CM | POA: Diagnosis not present

## 2022-07-23 ENCOUNTER — Other Ambulatory Visit: Payer: Self-pay | Admitting: Nurse Practitioner

## 2022-07-23 DIAGNOSIS — B49 Unspecified mycosis: Secondary | ICD-10-CM

## 2022-07-31 DIAGNOSIS — J309 Allergic rhinitis, unspecified: Secondary | ICD-10-CM | POA: Diagnosis not present

## 2022-08-08 ENCOUNTER — Telehealth: Payer: Self-pay | Admitting: Nurse Practitioner

## 2022-08-08 NOTE — Telephone Encounter (Signed)
Need to discuss CT head results and recommendation

## 2022-08-09 NOTE — Telephone Encounter (Signed)
Pt called back, needing a cb. Pt at [phone] [employer])

## 2022-08-12 NOTE — Telephone Encounter (Signed)
Informed Mr. Anthony Mills about CT head results, need for ENT referral and HIV test. He stated he has an upcoming appointment with [facility] neurology on 08/14/2022. He also stated that he donates blood every 3months, thus he is screened for HIV and hepatitis. He does not think another HIV test in needed. He agreed to schedule appointment with [facility] ENT. I provided him with number [phone]. I answered all his questions to his satisfaction.

## 2022-08-14 DIAGNOSIS — R251 Tremor, unspecified: Secondary | ICD-10-CM | POA: Diagnosis not present

## 2022-08-14 DIAGNOSIS — R200 Anesthesia of skin: Secondary | ICD-10-CM | POA: Insufficient documentation

## 2022-08-14 DIAGNOSIS — Z8669 Personal history of other diseases of the nervous system and sense organs: Secondary | ICD-10-CM | POA: Diagnosis not present

## 2022-08-14 DIAGNOSIS — R519 Headache, unspecified: Secondary | ICD-10-CM | POA: Diagnosis not present

## 2022-08-14 DIAGNOSIS — G8929 Other chronic pain: Secondary | ICD-10-CM | POA: Diagnosis not present

## 2022-08-16 ENCOUNTER — Ambulatory Visit: Payer: BC Managed Care – PPO | Admitting: Nurse Practitioner

## 2022-08-23 ENCOUNTER — Institutional Professional Consult (permissible substitution): Payer: BC Managed Care – PPO | Admitting: Pulmonary Disease

## 2022-08-23 DIAGNOSIS — R9082 White matter disease, unspecified: Secondary | ICD-10-CM | POA: Diagnosis not present

## 2022-08-23 DIAGNOSIS — Z8669 Personal history of other diseases of the nervous system and sense organs: Secondary | ICD-10-CM | POA: Diagnosis not present

## 2022-08-23 DIAGNOSIS — J323 Chronic sphenoidal sinusitis: Secondary | ICD-10-CM | POA: Diagnosis not present

## 2022-08-23 DIAGNOSIS — R200 Anesthesia of skin: Secondary | ICD-10-CM | POA: Diagnosis not present

## 2022-08-23 DIAGNOSIS — G8929 Other chronic pain: Secondary | ICD-10-CM | POA: Diagnosis not present

## 2022-08-23 DIAGNOSIS — R519 Headache, unspecified: Secondary | ICD-10-CM | POA: Diagnosis not present

## 2022-09-06 DIAGNOSIS — R200 Anesthesia of skin: Secondary | ICD-10-CM | POA: Diagnosis not present

## 2022-09-06 DIAGNOSIS — R519 Headache, unspecified: Secondary | ICD-10-CM | POA: Diagnosis not present

## 2022-09-06 DIAGNOSIS — R251 Tremor, unspecified: Secondary | ICD-10-CM | POA: Diagnosis not present

## 2022-09-20 DIAGNOSIS — J329 Chronic sinusitis, unspecified: Secondary | ICD-10-CM | POA: Insufficient documentation

## 2022-09-20 DIAGNOSIS — B49 Unspecified mycosis: Secondary | ICD-10-CM | POA: Insufficient documentation

## 2022-10-15 DIAGNOSIS — R3914 Feeling of incomplete bladder emptying: Secondary | ICD-10-CM | POA: Diagnosis not present

## 2022-10-15 DIAGNOSIS — R3916 Straining to void: Secondary | ICD-10-CM | POA: Diagnosis not present

## 2022-10-18 ENCOUNTER — Encounter: Payer: Self-pay | Admitting: Nurse Practitioner

## 2022-10-18 ENCOUNTER — Ambulatory Visit: Payer: BC Managed Care – PPO | Admitting: Nurse Practitioner

## 2022-10-18 VITALS — BP 110/70 | HR 82 | Temp 97.9°F | Resp 16 | Ht 71.0 in | Wt 203.0 lb

## 2022-10-18 DIAGNOSIS — J8410 Pulmonary fibrosis, unspecified: Secondary | ICD-10-CM

## 2022-10-18 DIAGNOSIS — E785 Hyperlipidemia, unspecified: Secondary | ICD-10-CM

## 2022-10-18 DIAGNOSIS — B49 Unspecified mycosis: Secondary | ICD-10-CM

## 2022-10-18 DIAGNOSIS — Z8042 Family history of malignant neoplasm of prostate: Secondary | ICD-10-CM | POA: Insufficient documentation

## 2022-10-18 DIAGNOSIS — H30033 Focal chorioretinal inflammation, peripheral, bilateral: Secondary | ICD-10-CM

## 2022-10-18 DIAGNOSIS — E538 Deficiency of other specified B group vitamins: Secondary | ICD-10-CM

## 2022-10-18 DIAGNOSIS — E1169 Type 2 diabetes mellitus with other specified complication: Secondary | ICD-10-CM

## 2022-10-18 DIAGNOSIS — J329 Chronic sinusitis, unspecified: Secondary | ICD-10-CM

## 2022-10-18 LAB — BASIC METABOLIC PANEL
BUN: 11 mg/dL (ref 6–23)
CO2: 28 meq/L (ref 19–32)
Calcium: 9.7 mg/dL (ref 8.4–10.5)
Chloride: 103 meq/L (ref 96–112)
Creatinine, Ser: 1.31 mg/dL (ref 0.40–1.50)
GFR: 58.49 mL/min — ABNORMAL LOW (ref >60.00)
Glucose, Bld: 104 mg/dL — ABNORMAL HIGH (ref 70–99)
Potassium: 4.4 meq/L (ref 3.5–5.1)
Sodium: 139 meq/L (ref 135–145)

## 2022-10-18 LAB — HEMOGLOBIN A1C: Hgb A1c MFr Bld: 6.5 % (ref 4.6–6.5)

## 2022-10-18 LAB — LDL CHOLESTEROL, DIRECT: Direct LDL: 96 mg/dL

## 2022-10-18 NOTE — Assessment & Plan Note (Signed)
Repeat LDL 

## 2022-10-18 NOTE — Assessment & Plan Note (Signed)
Diet controlled Negative DIABETES retinopathy, and neuropathy. Repeat hgbA1c, BMP and UACr today

## 2022-10-18 NOTE — Assessment & Plan Note (Addendum)
Under the care of [facility] pulmonology: Dr. Jerre Simon CT chest completed 2020:Development of small scattered bilateral groundglass opacities which can be rarely seen as a manifestation of sarcoidosis. An inflammatory/infectious etiology cannot be completely excluded.  Mediastinal adenopathy is essentially unchanged. Improvement in lower lobe perivascular interstitial opacities.  Bronchoscopy completed 02/2019: pathology of Lung, right, endobronchial biopsy.Reactive bronchial mucosa. Negative for granulomatous inflammation. Normal Spirometry 2021 CT chest 2022: Extensive and increasing mediastinal adenopathy. Stable calcified granulomas in the right upper and lower lobes. Negative HIV test 04/2021, normal ESR and CRP, normal copper. ACE at 150 Previous use of imuran and methotrexate, meds discontinued after 6months. Today he denies any SOB or cough Advised to f/up with Dr. Jerre Simon

## 2022-10-18 NOTE — Progress Notes (Signed)
Established Patient Visit  Patient: Anthony Mills   DOB: December 30, 1960   62 y.o. Male  MRN: [id number] Visit Date: 10/18/2022  Subjective:    Chief Complaint  Patient presents with   Medical Management of Chronic Issues   HPI Calcified granuloma of lung (HCC) Under the care of [facility] pulmonology: Dr. Jerre Simon CT chest completed 2020:Development of small scattered bilateral groundglass opacities which can be rarely seen as a manifestation of sarcoidosis. An inflammatory/infectious etiology cannot be completely excluded.  Mediastinal adenopathy is essentially unchanged. Improvement in lower lobe perivascular interstitial opacities.  Bronchoscopy completed 02/2019: pathology of Lung, right, endobronchial biopsy.Reactive bronchial mucosa. Negative for granulomatous inflammation. Normal Spirometry 2021 CT chest 2022: Extensive and increasing mediastinal adenopathy. Stable calcified granulomas in the right upper and lower lobes. Negative HIV test 04/2021, normal ESR and CRP, normal copper. ACE at 150 Previous use of imuran and methotrexate, meds discontinued after 6months. Today he denies any SOB or cough Advised to f/up with Dr. Jerre Simon  Peripheral focal chorioretinal inflammation of both eyes Under the care of Dr. Sherryll Burger with [facility]  Fungal sinusitis Under the care of [facility] Negative HIV Advised to schedule appointment with allergist as recommended by ENT  B12 deficiency Repeat B12  DM (diabetes mellitus) (HCC) Diet controlled Negative DIABETES retinopathy, and neuropathy. Repeat hgbA1c, BMP and UACr today  Hyperlipidemia associated with type 2 diabetes mellitus (HCC) Repeat LDL  Reviewed medical, surgical, and social history today  Medications: Outpatient Medications Prior to Visit  Medication Sig   cyanocobalamin (VITAMIN B12) 1000 MCG tablet Take 1,000 mcg by mouth daily.   gabapentin (NEURONTIN) 100 MG capsule Take 100 mg by mouth 3 (three)  times daily.   Lifitegrast (XIIDRA) 5 % SOLN Apply 1 drop to eye 2 (two) times daily.   rosuvastatin (CRESTOR) 20 MG tablet Take 1 tablet (20 mg total) by mouth daily.   tadalafil (CIALIS) 10 MG tablet Take 1 tablet (10 mg total) by mouth daily.   Turmeric (QC TUMERIC COMPLEX PO) Take 450 mg by mouth.   zinc gluconate 50 MG tablet Take 50 mg by mouth daily.   [DISCONTINUED] tamsulosin (FLOMAX) 0.4 MG CAPS capsule Take 0.4 mg by mouth daily.   No facility-administered medications prior to visit.   Reviewed past medical and social history.   ROS per HPI above      Objective:  BP 110/70 (BP Location: Left Arm, Patient Position: Sitting, Cuff Size: Normal)   Pulse 82   Temp 97.9 F (36.6 C) (Temporal)   Resp 16   Ht 5\' 11"  (1.803 m)   Wt 203 lb (92.1 kg)   SpO2 97%   BMI 28.31 kg/m      Physical Exam Cardiovascular:     Rate and Rhythm: Normal rate and regular rhythm.     Pulses: Normal pulses.     Heart sounds: Normal heart sounds.  Pulmonary:     Effort: Pulmonary effort is normal.     Breath sounds: Normal breath sounds.  Musculoskeletal:     Right lower leg: No edema.     Left lower leg: No edema.  Neurological:     Mental Status: He is alert and oriented to person, place, and time.     Results for orders placed or performed in visit on 10/18/22  HM DIABETES EYE EXAM  Result Value Ref Range   HM Diabetic Eye Exam No Retinopathy  No Retinopathy      Assessment & Plan:    Problem List Items Addressed This Visit     B12 deficiency    Repeat B12      Relevant Orders   B12   Calcified granuloma of lung (HCC)    Under the care of [facility] pulmonology: Dr. Jerre Simon CT chest completed 2020:Development of small scattered bilateral groundglass opacities which can be rarely seen as a manifestation of sarcoidosis. An inflammatory/infectious etiology cannot be completely excluded.  Mediastinal adenopathy is essentially unchanged. Improvement in lower lobe  perivascular interstitial opacities.  Bronchoscopy completed 02/2019: pathology of Lung, right, endobronchial biopsy.Reactive bronchial mucosa. Negative for granulomatous inflammation. Normal Spirometry 2021 CT chest 2022: Extensive and increasing mediastinal adenopathy. Stable calcified granulomas in the right upper and lower lobes. Negative HIV test 04/2021, normal ESR and CRP, normal copper. ACE at 150 Previous use of imuran and methotrexate, meds discontinued after 6months. Today he denies any SOB or cough Advised to f/up with Dr. Jerre Simon      DM (diabetes mellitus) (HCC) - Primary    Diet controlled Negative DIABETES retinopathy, and neuropathy. Repeat hgbA1c, BMP and UACr today      Relevant Orders   Hemoglobin A1c   Basic metabolic panel   Fungal sinusitis    Under the care of [facility] Negative HIV Advised to schedule appointment with allergist as recommended by ENT      Hyperlipidemia associated with type 2 diabetes mellitus (HCC)    Repeat LDL      Relevant Orders   Direct LDL   Peripheral focal chorioretinal inflammation of both eyes    Under the care of Dr. Sherryll Burger with [facility]      Return in about 3 months (around 01/18/2023) for DM, hyperlipidemia (fasting).     Alysia Penna, NP

## 2022-10-18 NOTE — Patient Instructions (Signed)
[facility] appointment with [facility]. Have report faxed to me Schedule appointment with pulmonology to re evaluate lung nodules and need for allergy test. Go to lab

## 2022-10-18 NOTE — Assessment & Plan Note (Signed)
Under the care of Dr. Sherryll Burger with [facility]

## 2022-10-18 NOTE — Assessment & Plan Note (Addendum)
Under the care of [facility] Negative HIV Advised to schedule appointment with allergist as recommended by ENT

## 2022-10-18 NOTE — Assessment & Plan Note (Signed)
Repeat B12 

## 2022-10-21 LAB — VITAMIN B12: Vitamin B-12: >1501 pg/mL — ABNORMAL HIGH (ref 211–911)

## 2022-10-22 ENCOUNTER — Other Ambulatory Visit: Payer: Self-pay | Admitting: Nurse Practitioner

## 2022-10-25 DIAGNOSIS — H3581 Retinal edema: Secondary | ICD-10-CM | POA: Diagnosis not present

## 2022-10-25 DIAGNOSIS — H2513 Age-related nuclear cataract, bilateral: Secondary | ICD-10-CM | POA: Diagnosis not present

## 2022-10-25 DIAGNOSIS — H30033 Focal chorioretinal inflammation, peripheral, bilateral: Secondary | ICD-10-CM | POA: Diagnosis not present

## 2022-10-25 DIAGNOSIS — D869 Sarcoidosis, unspecified: Secondary | ICD-10-CM | POA: Diagnosis not present

## 2022-10-31 ENCOUNTER — Other Ambulatory Visit: Payer: Self-pay | Admitting: Nurse Practitioner

## 2022-10-31 DIAGNOSIS — M19041 Primary osteoarthritis, right hand: Secondary | ICD-10-CM | POA: Diagnosis not present

## 2022-10-31 DIAGNOSIS — D8686 Sarcoid arthropathy: Secondary | ICD-10-CM | POA: Diagnosis not present

## 2022-10-31 DIAGNOSIS — M79671 Pain in right foot: Secondary | ICD-10-CM | POA: Diagnosis not present

## 2022-10-31 DIAGNOSIS — M79672 Pain in left foot: Secondary | ICD-10-CM | POA: Diagnosis not present

## 2022-10-31 DIAGNOSIS — M19071 Primary osteoarthritis, right ankle and foot: Secondary | ICD-10-CM | POA: Diagnosis not present

## 2022-10-31 DIAGNOSIS — M2011 Hallux valgus (acquired), right foot: Secondary | ICD-10-CM | POA: Diagnosis not present

## 2022-10-31 DIAGNOSIS — Z79899 Other long term (current) drug therapy: Secondary | ICD-10-CM | POA: Diagnosis not present

## 2022-10-31 DIAGNOSIS — M19072 Primary osteoarthritis, left ankle and foot: Secondary | ICD-10-CM | POA: Diagnosis not present

## 2022-10-31 DIAGNOSIS — M79642 Pain in left hand: Secondary | ICD-10-CM | POA: Diagnosis not present

## 2022-10-31 DIAGNOSIS — D8683 Sarcoid iridocyclitis: Secondary | ICD-10-CM | POA: Diagnosis not present

## 2022-10-31 DIAGNOSIS — M79641 Pain in right hand: Secondary | ICD-10-CM | POA: Diagnosis not present

## 2022-10-31 DIAGNOSIS — M2012 Hallux valgus (acquired), left foot: Secondary | ICD-10-CM | POA: Diagnosis not present

## 2022-10-31 DIAGNOSIS — Z791 Long term (current) use of non-steroidal anti-inflammatories (NSAID): Secondary | ICD-10-CM | POA: Diagnosis not present

## 2022-11-08 DIAGNOSIS — J320 Chronic maxillary sinusitis: Secondary | ICD-10-CM | POA: Diagnosis not present

## 2022-11-12 DIAGNOSIS — J301 Allergic rhinitis due to pollen: Secondary | ICD-10-CM | POA: Diagnosis not present

## 2022-11-13 DIAGNOSIS — D869 Sarcoidosis, unspecified: Secondary | ICD-10-CM | POA: Diagnosis not present

## 2022-11-13 DIAGNOSIS — Z201 Contact with and (suspected) exposure to tuberculosis: Secondary | ICD-10-CM | POA: Diagnosis not present

## 2022-11-13 DIAGNOSIS — R7612 Nonspecific reaction to cell mediated immunity measurement of gamma interferon antigen response without active tuberculosis: Secondary | ICD-10-CM | POA: Diagnosis not present

## 2022-11-22 DIAGNOSIS — J320 Chronic maxillary sinusitis: Secondary | ICD-10-CM | POA: Diagnosis not present

## 2022-11-22 DIAGNOSIS — J343 Hypertrophy of nasal turbinates: Secondary | ICD-10-CM | POA: Diagnosis not present

## 2022-11-22 DIAGNOSIS — J342 Deviated nasal septum: Secondary | ICD-10-CM | POA: Diagnosis not present

## 2022-11-28 DIAGNOSIS — N179 Acute kidney failure, unspecified: Secondary | ICD-10-CM | POA: Diagnosis not present

## 2022-11-28 DIAGNOSIS — D8686 Sarcoid arthropathy: Secondary | ICD-10-CM | POA: Diagnosis not present

## 2023-01-17 ENCOUNTER — Ambulatory Visit: Payer: BC Managed Care – PPO | Admitting: Nurse Practitioner

## 2023-01-17 ENCOUNTER — Encounter: Payer: Self-pay | Admitting: Nurse Practitioner

## 2023-01-17 VITALS — BP 108/75 | HR 62 | Temp 98.1°F | Resp 18 | Wt 205.4 lb

## 2023-01-17 DIAGNOSIS — E785 Hyperlipidemia, unspecified: Secondary | ICD-10-CM

## 2023-01-17 DIAGNOSIS — J322 Chronic ethmoidal sinusitis: Secondary | ICD-10-CM

## 2023-01-17 DIAGNOSIS — E559 Vitamin D deficiency, unspecified: Secondary | ICD-10-CM | POA: Diagnosis not present

## 2023-01-17 DIAGNOSIS — Z23 Encounter for immunization: Secondary | ICD-10-CM | POA: Diagnosis not present

## 2023-01-17 DIAGNOSIS — E1169 Type 2 diabetes mellitus with other specified complication: Secondary | ICD-10-CM

## 2023-01-17 DIAGNOSIS — R7612 Nonspecific reaction to cell mediated immunity measurement of gamma interferon antigen response without active tuberculosis: Secondary | ICD-10-CM | POA: Diagnosis not present

## 2023-01-17 DIAGNOSIS — Z227 Latent tuberculosis: Secondary | ICD-10-CM | POA: Insufficient documentation

## 2023-01-17 LAB — MICROALBUMIN / CREATININE URINE RATIO
Creatinine,U: 273.7 mg/dL
Microalb Creat Ratio: 0.3 mg/g (ref 0.0–30.0)
Microalb, Ur: <0.7 mg/dL (ref 0.0–1.9)

## 2023-01-17 LAB — LIPID PANEL
Cholesterol: 143 mg/dL (ref 0–200)
HDL: 47.7 mg/dL (ref >39.00)
LDL Cholesterol: 82 mg/dL (ref 0–99)
NonHDL: 95.43
Total CHOL/HDL Ratio: 3
Triglycerides: 69 mg/dL (ref 0.0–149.0)
VLDL: 13.8 mg/dL (ref 0.0–40.0)

## 2023-01-17 LAB — HEMOGLOBIN A1C: Hgb A1c MFr Bld: 6.6 % — ABNORMAL HIGH (ref 4.6–6.5)

## 2023-01-17 MED ORDER — ROSUVASTATIN CALCIUM 40 MG PO TABS: 40.0000 mg | ORAL_TABLET | Freq: Every day | ORAL | 1 refills | Status: DC

## 2023-01-17 MED ORDER — VITAMIN D (ERGOCALCIFEROL) 1.25 MG (50000 UNIT) PO CAPS: 50000.0000 [IU] | ORAL_CAPSULE | ORAL | 0 refills | Status: DC

## 2023-01-17 NOTE — Progress Notes (Signed)
Established Patient Visit  Patient: Anthony Mills   DOB: [birth year]   62 y.o. Male  MRN: [id number] Visit Date: 01/17/2023  Subjective:    Chief Complaint  Patient presents with   OFFICE VISIT    6 month follow up. PT is due for flu vaccine    Hyperlipidemia   Diabetes   HPI DM (diabetes mellitus) (HCC) Diet controlled Negative DIABETES retinopathy, and neuropathy. Repeat hgbA1c, BMP and UACr today  Hyperlipidemia associated with type 2 diabetes mellitus (HCC) Repeat Lipid panel  Positive QuantiFERON-TB Gold test Reviewed labs completed by [facility] on 10/2022, normal CXR. CT chest 2022: Extensive and increasing mediastinal adenopathy. Stable calcified granulomas in the right upper and lower lobes. She denies any weight loss, no night sweats, no cough, no SOB, no CP Entered referral to ID  Chronic sinusitis Repeat CT 10/2022: Mucosal thickening at both frontal ethmoid junction regions. Few opacified anterior ethmoid air cells on both sides. Large retention cyst filling the lower half of the left maxillary sinus. No free layering fluid. Small retention cyst in the left division of the sphenoid sinus. Nasal septum bows 4 mm towards the left with a left spur.   Under the care of [facility] allergist  Wt Readings from Last 3 Encounters:  01/17/23 205 lb 6.4 oz (93.2 kg)  10/18/22 203 lb (92.1 kg)  06/14/22 206 lb 6.4 oz (93.6 kg)    Reviewed medical, surgical, and social history today  Medications: Outpatient Medications Prior to Visit  Medication Sig   Lifitegrast (XIIDRA) 5 % SOLN Apply 1 drop to eye 2 (two) times daily.   rosuvastatin (CRESTOR) 20 MG tablet TAKE 1 TABLET BY MOUTH EVERY DAY   [DISCONTINUED] gabapentin (NEURONTIN) 100 MG capsule Take 100 mg by mouth 3 (three) times daily. (Patient not taking: Reported on 01/17/2023)   [DISCONTINUED] tadalafil (CIALIS) 10 MG tablet Take 1 tablet (10 mg total) by mouth daily. (Patient  not taking: Reported on 01/17/2023)   [DISCONTINUED] Turmeric (QC TUMERIC COMPLEX PO) Take 450 mg by mouth. (Patient not taking: Reported on 01/17/2023)   [DISCONTINUED] zinc gluconate 50 MG tablet Take 50 mg by mouth daily. (Patient not taking: Reported on 01/17/2023)   No facility-administered medications prior to visit.   Reviewed past medical and social history.   ROS per HPI above  Last CBC Lab Results  Component Value Date   WBC 3.7 (L) 06/14/2022   HGB 12.7 (L) 06/14/2022   HCT 39.6 06/14/2022   MCV 79.7 06/14/2022   MCH 26.4 10/25/2018   RDW 13.4 06/14/2022   PLT 216.0 06/14/2022   Last metabolic panel Lab Results  Component Value Date   GLUCOSE 104 (H) 10/18/2022   NA 139 10/18/2022   K 4.4 10/18/2022   CL 103 10/18/2022   CO2 28 10/18/2022   BUN 11 10/18/2022   CREATININE 1.31 10/18/2022   GFR 58.49 (L) 10/18/2022   CALCIUM 9.7 10/18/2022   PROT 7.4 05/03/2022   ALBUMIN 4.3 05/03/2022   BILITOT 0.8 05/03/2022   ALKPHOS 65 05/03/2022   AST 19 05/03/2022   ALT 13 05/03/2022   ANIONGAP 11 10/25/2018   Last lipids Lab Results  Component Value Date   CHOL 155 01/09/2022   HDL 53.50 01/09/2022   LDLCALC 85 01/09/2022   LDLDIRECT 96.0 10/18/2022   TRIG 84.0 01/09/2022   CHOLHDL 3 01/09/2022   Last hemoglobin A1c Lab Results  Component Value Date   HGBA1C 6.5 10/18/2022   Last vitamin B12 and Folate Lab Results  Component Value Date   VITAMINB12 >1501 (H) 10/18/2022      Objective:  BP 108/75 (BP Location: Left Arm, Patient Position: Sitting, Cuff Size: Large)   Pulse 62   Temp 98.1 F (36.7 C) (Temporal)   Resp 18   Wt 205 lb 6.4 oz (93.2 kg)   SpO2 98%   BMI 28.65 kg/m      Physical Exam Vitals and nursing note reviewed.  Cardiovascular:     Rate and Rhythm: Normal rate and regular rhythm.     Pulses: Normal pulses.     Heart sounds: Normal heart sounds.  Pulmonary:     Effort: Pulmonary effort is normal.     Breath sounds: Normal  breath sounds.  Neurological:     Mental Status: He is alert and oriented to person, place, and time.     No results found for any visits on 01/17/23.    Assessment & Plan:    Problem List Items Addressed This Visit     Chronic sinusitis    Repeat CT 10/2022: Mucosal thickening at both frontal ethmoid junction regions. Few opacified anterior ethmoid air cells on both sides. Large retention cyst filling the lower half of the left maxillary sinus. No free layering fluid. Small retention cyst in the left division of the sphenoid sinus. Nasal septum bows 4 mm towards the left with a left spur.   Under the care of [facility] allergist      DM (diabetes mellitus) (HCC) - Primary    Diet controlled Negative DIABETES retinopathy, and neuropathy. Repeat hgbA1c, BMP and UACr today      Relevant Orders   Hemoglobin A1c   Microalbumin / creatinine urine ratio   Hyperlipidemia associated with type 2 diabetes mellitus (HCC)    Repeat Lipid panel      Relevant Orders   Lipid panel   Positive QuantiFERON-TB Gold test    Reviewed labs completed by [facility] on 10/2022, normal CXR. CT chest 2022: Extensive and increasing mediastinal adenopathy. Stable calcified granulomas in the right upper and lower lobes. She denies any weight loss, no night sweats, no cough, no SOB, no CP Entered referral to ID      Relevant Orders   Ambulatory referral to Infectious Disease   Vitamin D deficiency   Relevant Medications   Vitamin D, Ergocalciferol, (DRISDOL) 1.25 MG (50000 UNIT) CAPS capsule   Other Visit Diagnoses     Immunization due       Relevant Orders   Flu vaccine trivalent PF, 6mos and older(Flulaval,Afluria,Fluarix,Fluzone) (Completed)      Return in about 6 months (around 07/17/2023) for DM, hyperlipidemia (fasting).     Alysia Penna, NP

## 2023-01-17 NOTE — Addendum Note (Signed)
Addended by: Michaela Corner on: 01/17/2023 03:27 PM   Modules accepted: Orders

## 2023-01-17 NOTE — Assessment & Plan Note (Signed)
Repeat CT 10/2022: Mucosal thickening at both frontal ethmoid junction regions. Few opacified anterior ethmoid air cells on both sides. Large retention cyst filling the lower half of the left maxillary sinus. No free layering fluid. Small retention cyst in the left division of the sphenoid sinus. Nasal septum bows 4 mm towards the left with a left spur.   Under the care of [facility] allergist

## 2023-01-17 NOTE — Assessment & Plan Note (Signed)
Diet controlled Negative DIABETES retinopathy, and neuropathy. Repeat hgbA1c, BMP and UACr today

## 2023-01-17 NOTE — Assessment & Plan Note (Signed)
-  Repeat Lipid panel.

## 2023-01-17 NOTE — Assessment & Plan Note (Addendum)
Reviewed labs completed by [facility] on 10/2022, normal CXR. CT chest 2022: Extensive and increasing mediastinal adenopathy. Stable calcified granulomas in the right upper and lower lobes. She denies any weight loss, no night sweats, no cough, no SOB, no CP Entered referral to ID

## 2023-01-17 NOTE — Patient Instructions (Addendum)
Schedule f/up appointment with rheumatology. You will be contacted to schedule appointment [facility] infectious [facility]. Go to lab

## 2023-01-27 ENCOUNTER — Other Ambulatory Visit (HOSPITAL_COMMUNITY): Payer: Self-pay

## 2023-01-29 ENCOUNTER — Ambulatory Visit (INDEPENDENT_AMBULATORY_CARE_PROVIDER_SITE_OTHER): Payer: BC Managed Care – PPO | Admitting: Infectious Diseases

## 2023-01-29 ENCOUNTER — Encounter: Payer: Self-pay | Admitting: Infectious Diseases

## 2023-01-29 ENCOUNTER — Other Ambulatory Visit: Payer: Self-pay

## 2023-01-29 VITALS — BP 130/80 | HR 88 | Resp 16 | Ht 71.0 in | Wt 205.0 lb

## 2023-01-29 DIAGNOSIS — D869 Sarcoidosis, unspecified: Secondary | ICD-10-CM | POA: Diagnosis not present

## 2023-01-29 DIAGNOSIS — Z114 Encounter for screening for human immunodeficiency virus [HIV]: Secondary | ICD-10-CM

## 2023-01-29 DIAGNOSIS — R7612 Nonspecific reaction to cell mediated immunity measurement of gamma interferon antigen response without active tuberculosis: Secondary | ICD-10-CM | POA: Diagnosis not present

## 2023-01-29 NOTE — Progress Notes (Signed)
[facility]                                      [address], [place], [place], [address]                                               Phn. [phone]; Fax: [phone]                                                             Date: 01/29/23  Reason for Visit: Positive Quantiferon     HPI: Anthony Mills is a 62 y.o.old male with   The patient, with a history of sarcoidosis and Bell's palsy, presents for evaluation of a positive Quantiferon test. This is the first time they have heard about potential tuberculosis exposure. They deny any personal or family history of tuberculosis, incarceration, homelessness, IV drug use, or work in healthcare settings. 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 They were born in [place], [place] and have traveled out of the country to the [place] in 2016.  The patient's sarcoidosis, which affected their eyes and caused enlarged lymph nodes on CT, has been cleared by their lung doctor and rheumatologist. They continue to see an eye doctor for dry eye. They had two bouts of Bell's palsy in 2017 due to stress from a broken heart. They also had surgery to remove a lymph node, which has resulted in numbness on one side of their face. They deny any weight loss, loss of appetite, fevers, chills, night sweats, persistent cough, chest pain, shortness of breath, hemoptysis, nausea, vomiting, diarrhea, swelling, urinary problems, rashes, joint pain, back pain, and blurry vision. They report occasional headaches.  Denies cough more than 2 weeks, night sweats, SOB, loss of appetite and loss of weight. Risk factor for reactivation    ROS: Denies yellowish discoloration of sclera and skin, abdominal pain/distension, hematemesis.            Denies fever, chills, nightsweats, nausea, vomiting, diarrhea, constipation, weight loss, recent hospitalizations, rashes, joint complaints,  shortness of breath, cough, chest pain, headaches, GU complaints.    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 Koppe nodules, elevated serum ACE Formatting of this note might be different from the original. Koppe nodules, elevated serum ACE  Formatting of this note might be different from the original. Koppe nodules, elevated serum ACE Formatting of this note might be different from the original. Koppe nodules, elevated serum ACE     Testicular pain 09/23/2018   Past Surgical History:  Procedure Laterality Date   CIRCUMCISION  COLONOSCOPY     HERNIA REPAIR     KNEE SURGERY Left    left wrist surgery     MASS EXCISION N/A 10/25/2021   Procedure: EXCISION OF SCALP MASSES;  Surgeon: Diamantina Monks, MD;  Location: [facility] OR;  Service: General;  Laterality: N/A;   Social History   Socioeconomic History   Marital status: Divorced    Spouse name: Not on file   Number of children: Not on file   Years of education: Not on file   Highest education level: Not on file  Occupational History   Not on file  Tobacco Use   Smoking status: Never   Smokeless tobacco: Never  Vaping Use   Vaping status: Never Used  Substance and Sexual Activity   Alcohol use: No   Drug use: No   Sexual activity: Yes  Other Topics Concern   Not on file  Social History Narrative   Not on file   Social Determinants of Health   Financial Resource Strain: Not on file  Food Insecurity: Low Risk  (11/22/2022)   Received from [facility]   Hunger Vital Sign    Worried About Running Out of Food in the Last Year: Never true     Ran Out of Food in the Last Year: Never true  Transportation Needs: No Transportation Needs (11/22/2022)   Received from [facility]    In the past 12 months, has lack of reliable transportation kept you from medical appointments, meetings, work or from getting things needed for daily living? : No  Physical Activity: Not on file  Stress: Not on file  Social Connections: Unknown (07/09/2021)   Received from [facility]   Social Network    Social Network: Not on file  Intimate Partner Violence: Unknown (05/31/2021)   Received from [facility], [facility]   HITS    Physically Hurt: Not on file    Insult or Talk Down To: Not on file    Threaten Physical Harm: Not on file    Scream or Curse: Not on file   Family History  Problem Relation Age of Onset   Cancer Mother 84       Breast cancer   Breast cancer Mother    Cancer Father 50       Prostate Cancer   Prostate cancer Father    Cancer Sister 1       breast Cancer   Breast cancer Sister    Asthma Nephew    Colon cancer Neg Hx    Esophageal cancer Neg Hx    Liver cancer Neg Hx    Pancreatic cancer Neg Hx    Rectal cancer Neg Hx    Stomach cancer Neg Hx    Colon polyps Neg Hx      Physical exam: There were no vitals taken for this visit.  Gen: Alert and oriented x 3, no acute distress HEENT: [place]/AT, no scleral icterus, no pale conjunctivae, hearing normal, oral mucosa moist Neck: Supple Cardio: Regular rate and rhythm; +S1 and S2 Resp: CTAB; no wheezes, rhonchi, or rales GI: Soft, nontender, nondistended,  GU: Musc: Extremities: No pedal edema Skin: No obvious rashes Neuro: grossly non focal, awake, alert and oriented  Psych: Calm, cooperative   Laboratory  HIV    Pertinent Imaging  Assessment/Plan:  Positive Quantiferon Discussed about Quantiferon test, latent TB and active TB at length Discussed about different treatment options for treatment of latent TB  including its side effects  Follow up in 2 weeks     Smoking/Alcohol/Illicit substance use   Immunization    I have personally spent more than 70 minutes involved in face-to-face and non-face-to-face activities for this patient on the day of the visit. Professional time spent includes the following activities: Preparing to see the patient (review of tests), Obtaining and/or reviewing separately obtained history (admission/discharge record), Performing a medically appropriate examination and/or evaluation , Ordering medications/tests/procedures, referring and communicating with other health care professionals, Documenting clinical information in the EMR, Independently interpreting results (not separately reported), Communicating results to the patient/family/caregiver, Counseling and educating the patient/family/caregiver and Care coordination (not separately reported).    Patients questions were addressed and answered.   Of note, portions of this note may have been created with voice recognition software. While this note has been edited for accuracy, occasional wrong-word or 'sound-a-like' substitutions may have occurred due to the inherent limitations of voice recognition software.   Electronically signed by:  Odette Fraction, MD Infectious Diseases  Office phone [phone] Fax no. [phone]

## 2023-01-30 DIAGNOSIS — D869 Sarcoidosis, unspecified: Secondary | ICD-10-CM | POA: Insufficient documentation

## 2023-01-30 LAB — COMPREHENSIVE METABOLIC PANEL
AG Ratio: 1.5 (calc) (ref 1.0–2.5)
ALT: 13 U/L (ref 9–46)
AST: 19 U/L (ref 10–35)
Albumin: 4.1 g/dL (ref 3.6–5.1)
Alkaline phosphatase (APISO): 57 U/L (ref 35–144)
BUN: 10 mg/dL (ref 7–25)
CO2: 30 mmol/L (ref 20–32)
Calcium: 9.5 mg/dL (ref 8.6–10.3)
Chloride: 106 mmol/L (ref 98–110)
Creat: 1.3 mg/dL (ref 0.70–1.35)
Globulin: 2.7 g/dL (ref 1.9–3.7)
Glucose, Bld: 86 mg/dL (ref 65–99)
Potassium: 4.8 mmol/L (ref 3.5–5.3)
Sodium: 141 mmol/L (ref 135–146)
Total Bilirubin: 0.7 mg/dL (ref 0.2–1.2)
Total Protein: 6.8 g/dL (ref 6.1–8.1)

## 2023-01-30 LAB — HIV ANTIBODY (ROUTINE TESTING W REFLEX): HIV 1&2 Ab, 4th Generation: NONREACTIVE

## 2023-02-05 ENCOUNTER — Telehealth: Payer: Self-pay | Admitting: Nurse Practitioner

## 2023-02-05 NOTE — Telephone Encounter (Signed)
Please give pt a call . He said it is about a medication but not a refill

## 2023-02-06 ENCOUNTER — Other Ambulatory Visit (HOSPITAL_COMMUNITY): Payer: Self-pay

## 2023-02-06 ENCOUNTER — Telehealth: Payer: Self-pay

## 2023-02-06 ENCOUNTER — Encounter: Payer: Self-pay | Admitting: Pharmacist

## 2023-02-06 DIAGNOSIS — R7612 Nonspecific reaction to cell mediated immunity measurement of gamma interferon antigen response without active tuberculosis: Secondary | ICD-10-CM

## 2023-02-06 DIAGNOSIS — D8683 Sarcoid iridocyclitis: Secondary | ICD-10-CM | POA: Diagnosis not present

## 2023-02-06 DIAGNOSIS — D869 Sarcoidosis, unspecified: Secondary | ICD-10-CM | POA: Diagnosis not present

## 2023-02-06 DIAGNOSIS — D8686 Sarcoid arthropathy: Secondary | ICD-10-CM | POA: Diagnosis not present

## 2023-02-06 MED ORDER — ISONIAZID 300 MG PO TABS
900.0000 mg | ORAL_TABLET | ORAL | 2 refills | Status: DC
Start: 2023-02-06 — End: 2023-06-09

## 2023-02-06 MED ORDER — RIFAPENTINE 150 MG PO TABS: 900.0000 mg | ORAL_TABLET | ORAL | 2 refills | Status: DC

## 2023-02-06 NOTE — Progress Notes (Signed)
LVM and sent MyChart to discuss treatment plan. Will send prescriptions once I know his preferred pharmacy. No DDI with current medications. Total cost will be ~$80/month.

## 2023-02-06 NOTE — Addendum Note (Signed)
Addended by: Jennette Kettle on: 02/06/2023 04:46 PM   Modules accepted: Orders

## 2023-02-06 NOTE — Telephone Encounter (Signed)
-----   Message from Victoriano Lain sent at 02/06/2023  2:41 PM EST ----- Thanks ----- Message ----- From: Jennette Kettle, RPH-CPP Sent: 02/06/2023   2:11 PM EST To: Odette Fraction, MD  LVM and sent MyChart to discuss treatment plan. Will send prescriptions once I know his preferred pharmacy. No DDI with current medications. Total cost will be ~$80/month.

## 2023-02-06 NOTE — Telephone Encounter (Signed)
Spoke with patient over the phone to review counseling points for latent TB treatment. Anthony Mills will be taking isoniazid 900 mg and rifapentine 900 mg once weekly for 3 months. Reviewed to take isoniazid on an empty stomach and to let us know if they experience any nausea or vomiting as we can prescribe anti-nausea medicine. Counseled to also take pyridoxine 50 mg once weekly with isoniazid to prevent risk for peripheral neuropathy. Discussed the importance of taking rifapentine WITH food. Will check LFTs in one month. Prescriptions sent to [facility]; monthly copay is ~$80. All questions answered; patient verbalizes understanding.  Margarite Gouge, PharmD, CPP, BCIDP, AAHIVP Clinical Pharmacist Practitioner Infectious Diseases Clinical Pharmacist [facility]

## 2023-02-06 NOTE — Telephone Encounter (Signed)
Spoke to patient and advised him to decrease Crestor dosage; take 1/2 tab for 3 weeks. Patient verbalized understanding.

## 2023-02-06 NOTE — Telephone Encounter (Signed)
Patient left voicemail requesting lab results from 12/4. Will forward message to provider to review results. Will call back once done.  Juanita Laster, RMA

## 2023-02-17 ENCOUNTER — Telehealth: Payer: Self-pay

## 2023-02-17 NOTE — Telephone Encounter (Signed)
Patient called requesting to speak with a nurse regarding medications. Informed him of all medications prescribed and directions on medications.  If patient calls back instructions was also given by our pharmacist Marchelle Folks via my chart for him to review.    Cieanna Stormes Lesli Albee, CMA

## 2023-02-25 ENCOUNTER — Telehealth: Payer: Self-pay | Admitting: Nurse Practitioner

## 2023-02-25 NOTE — Telephone Encounter (Signed)
 Copied from CRM [id number]. Topic: General - Call Back - No Documentation >> Feb 25, 2023  9:05 AM Samuel Jester B wrote: Reason for CRM: Pt is requesting a callback from his pcp's nurse to speak with her regarding some information he is needing to follow up on.

## 2023-02-27 DIAGNOSIS — G629 Polyneuropathy, unspecified: Secondary | ICD-10-CM | POA: Diagnosis not present

## 2023-03-21 ENCOUNTER — Ambulatory Visit: Payer: BC Managed Care – PPO | Admitting: Infectious Diseases

## 2023-03-27 ENCOUNTER — Encounter: Payer: Self-pay | Admitting: Internal Medicine

## 2023-03-27 ENCOUNTER — Other Ambulatory Visit: Payer: Self-pay

## 2023-03-27 ENCOUNTER — Ambulatory Visit (INDEPENDENT_AMBULATORY_CARE_PROVIDER_SITE_OTHER): Payer: BC Managed Care – PPO | Admitting: Internal Medicine

## 2023-03-27 VITALS — BP 126/81 | HR 72 | Temp 98.0°F | Ht 71.0 in | Wt 205.0 lb

## 2023-03-27 DIAGNOSIS — R7612 Nonspecific reaction to cell mediated immunity measurement of gamma interferon antigen response without active tuberculosis: Secondary | ICD-10-CM | POA: Diagnosis not present

## 2023-03-27 NOTE — Progress Notes (Signed)
Patient: Anthony Mills  DOB: [birth year] MRN: [id number] PCP: Anne Ng, NP    Chief Complaint  Patient presents with   Follow-up     Patient Active Problem List   Diagnosis Date Noted   Sarcoidosis 01/30/2023   Encounter for screening for HIV 01/29/2023   Positive QuantiFERON-TB Gold test 01/17/2023   Vitamin D deficiency 01/17/2023   FHx: cancer of prostate 10/18/2022   Chronic sinusitis 09/20/2022   Paresthesia 06/14/2022   Erectile dysfunction 09/28/2021   Hyperlipidemia associated with type 2 diabetes mellitus (HCC) 09/28/2021   B12 deficiency 11/02/2019   DM (diabetes mellitus) (HCC) 02/22/2019   Benign prostatic hyperplasia (BPH) with urinary urgency 11/13/2018   Male hypogonadism 11/13/2018   Gastroesophageal reflux disease 10/16/2018   Pharyngoesophageal dysphagia 10/16/2018   Headache 09/11/2018   Seasonal allergic rhinitis due to pollen 07/28/2018   Keratoconjunctivitis sicca of both eyes not specified as Sjogren's 03/18/2018   Nuclear sclerotic cataract of both eyes 03/18/2018   Presbyopia 03/18/2018   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 M presents for follow up of latent TB. He started INH/rifapentin/b6  weekly on 02/21/24->EOT 05/15/23. No issues with meds. No missed doses. HPI from 01/29/23: "Anthony Mills is a 63 y.o.oldold male with history of DM, HLD, chronic sinusitis ( follows allergist [facility]), sarcoidosis/uveitis and Bell's palsy, presents for evaluation of a positive Quantiferon test.   Denies prior h/o TB, latent TB or treatment for those. Denies any close contacts or family history of tuberculosis. Denies h/o incarceration, homelessness, IV drug use, or work in healthcare settings. 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 He was  born in [place], [place] and denies travel to [place] or african countries.    Denies fevers, chills, night sweats, persistent cough, chest pain, shortness of breath, hemoptysis. Denies any loss of appetite or weight loss. Denies nausea, vomiting, diarrhea, Lymph node swelling, urinary problems, rashes, joint pain, back pain, and blurry vision. He  reports occasional headaches.   He reports h/o sarcoidosis, which affected his eyes and lymph nodes in chest. However he has been cleared by Pulmonologist and rheumatologist. He continues to to see an eye doctor. Last seen 10/25/22. No signs of active inflammation and plan to monitor off IMT and fu in 6 months for reevaluation. Denies being on any immunosuppressive medications. Reports he had two bouts of Bell's palsy in 2017 due to stress when he broke up with his ex girlfriend. He also reports having  surgery to remove a lymph node  in his scalp which has resulted in numbness on rt side of their face. They deny any weight loss, loss of appetite, fevers, chills, night sweats, persistent cough, chest pain, shortness of breath, hemoptysis, nausea, vomiting, diarrhea, swelling, urinary problems, rashes, joint pain, back pain, and blurry vision. They report occasional headaches."   Review of Systems  All other systems reviewed and are negative.   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 Koppe nodules, elevated serum ACE Formatting of this note might be different from the original. Koppe nodules, elevated serum ACE  Formatting of this note might be different from the original. Koppe nodules, elevated serum ACE Formatting of this note might  be different from the original. Koppe nodules, elevated serum ACE     Testicular pain 09/23/2018    Outpatient Medications Prior to Visit  Medication Sig Dispense Refill   isoniazid (NYDRAZID) 300 MG tablet Take 3 tablets (900 mg total) by mouth once a week. Take on an empty stomach. Take pyridoxine (vitamin B6) 50 mg once weekly with isoniazid. 12 tablet 2   Lifitegrast (XIIDRA) 5 % SOLN Apply 1 drop to eye 2 (two) times daily.     Rifapentine 150 MG TABS Take 6 tablets (900 mg total) by mouth once a week. Take with a full meal. 24 tablet 2   rosuvastatin (CRESTOR) 40 MG tablet Take 1 tablet (40 mg total) by mouth daily. 90 tablet 1   Vitamin D, Ergocalciferol, (DRISDOL) 1.25 MG (50000 UNIT) CAPS capsule Take 1 capsule (50,000 Units total) by mouth every 7 (seven) days. 12 capsule 0   No facility-administered medications prior to visit.     Allergies  Allergen Reactions   Codeine Other (See Comments)    hallucinate    Social History   Tobacco Use   Smoking status: Never   Smokeless tobacco: Never  Vaping Use   Vaping status: Never Used  Substance Use Topics   Alcohol use: No   Drug use: No    Family History  Problem Relation Age of Onset   Cancer Mother 32       Breast cancer   Breast cancer Mother    Cancer Father 28       Prostate Cancer   Prostate cancer Father    Cancer Sister 34       breast Cancer   Breast cancer Sister    Asthma Nephew    Colon cancer Neg Hx    Esophageal cancer Neg Hx    Liver cancer Neg Hx    Pancreatic cancer Neg Hx    Rectal cancer Neg Hx    Stomach cancer Neg Hx    Colon polyps Neg Hx     Objective:   Vitals:   03/27/23 1027  BP: 126/81  Pulse: 72  Temp: 98 F (36.7 C)  TempSrc: Temporal  SpO2: 99%  Weight: 205 lb (93 kg)  Height: 5\' 11"  (1.803 m)   Body mass index is 28.59 kg/m.  Physical Exam Constitutional:      General: He is not in acute distress.    Appearance: He is normal weight. He is not toxic-appearing.   HENT:     Head: Normocephalic and atraumatic.     Right Ear: External ear normal.     Left Ear: External ear normal.     Nose: No congestion or rhinorrhea.     Mouth/Throat:     Mouth: Mucous membranes are moist.     Pharynx: Oropharynx is clear.  Eyes:     Extraocular Movements: Extraocular movements intact.     Conjunctiva/sclera: Conjunctivae normal.     Pupils: Pupils are equal, round, and reactive to light.  Cardiovascular:     Rate and Rhythm: Normal rate and regular rhythm.     Heart sounds: No murmur heard.    No friction rub. No gallop.  Pulmonary:  Effort: Pulmonary effort is normal.     Breath sounds: Normal breath sounds.  Abdominal:     General: Abdomen is flat. Bowel sounds are normal.     Palpations: Abdomen is soft.  Musculoskeletal:        General: No swelling. Normal range of motion.     Cervical back: Normal range of motion and neck supple.  Skin:    General: Skin is warm and dry.  Neurological:     General: No focal deficit present.     Mental Status: He is oriented to person, place, and time.  Psychiatric:        Mood and Affect: Mood normal.     Lab Results: Lab Results  Component Value Date   WBC 3.7 (L) 06/14/2022   HGB 12.7 (L) 06/14/2022   HCT 39.6 06/14/2022   MCV 79.7 06/14/2022   PLT 216.0 06/14/2022    Lab Results  Component Value Date   CREATININE 1.30 01/29/2023   BUN 10 01/29/2023   NA 141 01/29/2023   K 4.8 01/29/2023   CL 106 01/29/2023   CO2 30 01/29/2023    Lab Results  Component Value Date   ALT 13 01/29/2023   AST 19 01/29/2023   ALKPHOS 65 05/03/2022   BILITOT 0.7 01/29/2023     Assessment & Plan:  #Latent TB -Started treatment with INH/rifapentine/Pyridoxine 50 mg once weekly on 02/21/24->EOT 05/15/23 -Pt is asymptomatic -F/U on 3/12 with Dr. Elinor Parkinson towards end of treatment -Labs today   Danelle Earthly, MD [facility]  I have personally spent 35   minutes involved in face-to-face and non-face-to-face activities for this patient on the day of the visit. Professional time spent includes the following activities: Preparing to see the patient (review of tests), Obtaining and/or reviewing separately obtained history (admission/discharge record), Performing a medically appropriate examination and/or evaluation , Ordering medications/tests/procedures, referring and communicating with other health care professionals, Documenting clinical information in the EMR, Independently interpreting results (not separately reported), Communicating results to the patient/family/caregiver, Counseling and educating the patient/family/caregiver and Care coordination (not separately reported).   03/27/23  10:40 AM

## 2023-03-28 LAB — COMPLETE METABOLIC PANEL WITH GFR
AG Ratio: 1.5 (calc) (ref 1.0–2.5)
ALT: 16 U/L (ref 9–46)
AST: 27 U/L (ref 10–35)
Albumin: 4.1 g/dL (ref 3.6–5.1)
Alkaline phosphatase (APISO): 67 U/L (ref 35–144)
BUN: 11 mg/dL (ref 7–25)
CO2: 26 mmol/L (ref 20–32)
Calcium: 9.4 mg/dL (ref 8.6–10.3)
Chloride: 107 mmol/L (ref 98–110)
Creat: 1.23 mg/dL (ref 0.70–1.35)
Globulin: 2.8 g/dL (ref 1.9–3.7)
Glucose, Bld: 83 mg/dL (ref 65–99)
Potassium: 4.5 mmol/L (ref 3.5–5.3)
Sodium: 141 mmol/L (ref 135–146)
Total Bilirubin: 0.4 mg/dL (ref 0.2–1.2)
Total Protein: 6.9 g/dL (ref 6.1–8.1)
eGFR: 66 mL/min/{1.73_m2} (ref >=60)

## 2023-03-28 LAB — CBC WITH DIFFERENTIAL/PLATELET
Absolute Lymphocytes: 1078 {cells}/uL (ref 850–3900)
Absolute Monocytes: 639 {cells}/uL (ref 200–950)
Basophils Absolute: 51 {cells}/uL (ref 0–200)
Basophils Relative: 1.5 %
Eosinophils Absolute: 150 {cells}/uL (ref 15–500)
Eosinophils Relative: 4.4 %
HCT: 37.1 % — ABNORMAL LOW (ref 38.5–50.0)
Hemoglobin: 10.8 g/dL — ABNORMAL LOW (ref 13.2–17.1)
MCH: 19.5 pg — ABNORMAL LOW (ref 27.0–33.0)
MCHC: 29.1 g/dL — ABNORMAL LOW (ref 32.0–36.0)
MCV: 66.8 fL — ABNORMAL LOW (ref 80.0–100.0)
MPV: 10.3 fL (ref 7.5–12.5)
Monocytes Relative: 18.8 %
Neutro Abs: 1482 {cells}/uL — ABNORMAL LOW (ref 1500–7800)
Neutrophils Relative %: 43.6 %
Platelets: 269 10*3/uL (ref 140–400)
RBC: 5.55 10*6/uL (ref 4.20–5.80)
RDW: 16.4 % — ABNORMAL HIGH (ref 11.0–15.0)
Total Lymphocyte: 31.7 %
WBC: 3.4 10*3/uL — ABNORMAL LOW (ref 3.8–10.8)

## 2023-04-11 ENCOUNTER — Telehealth: Payer: Self-pay

## 2023-04-11 NOTE — Telephone Encounter (Signed)
Patient called requesting lab results from 03/27/2023, please advise.    Lenny Bouchillon Lesli Albee, CMA

## 2023-04-14 NOTE — Telephone Encounter (Signed)
Patient returned call. Confirmed he was able to read MyChart message.  He is asking why he needs to follow up with primary care when our office ordered the labs. Discussed that finding of anemia is not infectious and therefore would require workup by his PCP. Patient verbalized understanding and has no further questions.   Sandie Ano, RN

## 2023-04-14 NOTE — Telephone Encounter (Signed)
Left voicemail asking patient to return my call.   Will also send my chart message.

## 2023-05-07 ENCOUNTER — Ambulatory Visit: Payer: BC Managed Care – PPO | Admitting: Infectious Diseases

## 2023-05-29 ENCOUNTER — Ambulatory Visit: Admitting: Internal Medicine

## 2023-06-09 ENCOUNTER — Other Ambulatory Visit: Payer: Self-pay

## 2023-06-09 ENCOUNTER — Ambulatory Visit (INDEPENDENT_AMBULATORY_CARE_PROVIDER_SITE_OTHER): Admitting: Internal Medicine

## 2023-06-09 VITALS — BP 120/76 | HR 83 | Ht 70.0 in | Wt 200.0 lb

## 2023-06-09 DIAGNOSIS — R7612 Nonspecific reaction to cell mediated immunity measurement of gamma interferon antigen response without active tuberculosis: Secondary | ICD-10-CM | POA: Diagnosis not present

## 2023-06-09 NOTE — Progress Notes (Unsigned)
 Patient Active Problem List   Diagnosis Date Noted   Sarcoidosis 01/30/2023   Encounter for screening for HIV 01/29/2023   Positive QuantiFERON-TB Gold test 01/17/2023   Vitamin D deficiency 01/17/2023   FHx: cancer of prostate 10/18/2022   Chronic sinusitis 09/20/2022   Paresthesia 06/14/2022   Erectile dysfunction 09/28/2021   Hyperlipidemia associated with type 2 diabetes mellitus (HCC) 09/28/2021   B12 deficiency 11/02/2019   DM (diabetes mellitus) (HCC) 02/22/2019   Benign prostatic hyperplasia (BPH) with urinary urgency 11/13/2018   Male hypogonadism 11/13/2018   Gastroesophageal reflux disease 10/16/2018   Pharyngoesophageal dysphagia 10/16/2018   Headache 09/11/2018   Seasonal allergic rhinitis due to pollen 07/28/2018   Keratoconjunctivitis sicca of both eyes not specified as Sjogren's 03/18/2018   Nuclear sclerotic cataract of both eyes 03/18/2018   Presbyopia 03/18/2018   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 Take on an empty stomach. Take pyridoxine (vitamin B6) 50 mg once weekly with isoniazid.   LIFITEGRAST (XIIDRA) 5 % SOLN    Apply 1 drop to eye 2 (two) times daily.   RIFAPENTINE 150 MG TABS    Take 6 tablets (900 mg total) by mouth once a week. Take with a full meal.   ROSUVASTATIN (CRESTOR) 40 MG TABLET    Take 1 tablet (40 mg total) by mouth daily.   VITAMIN D, ERGOCALCIFEROL, (DRISDOL) 1.25 MG (50000 UNIT) CAPS CAPSULE    Take 1 capsule (50,000 Units total) by mouth every 7 (seven) days.  Modified Medications   No medications on file  Discontinued Medications   No medications on file    Subjective: ***  Today  @DATE @ : Discussed the use of AI scribe software for clinical note transcription with the patient, who gave verbal consent to proceed.  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 Koppe nodules, elevated serum ACE Formatting of this note might be different from the original. Koppe nodules, elevated serum ACE  Formatting of this note might be different from the original. Koppe nodules, elevated serum ACE Formatting of this note might be different from the original. Koppe nodules, elevated serum ACE     Testicular pain 09/23/2018    Social History   Tobacco Use   Smoking status: Never   Smokeless tobacco: Never  Vaping Use   Vaping status: Never Used  Substance Use Topics   Alcohol use: No   Drug use: No    Family History  Problem Relation Age of Onset   Cancer Mother 67       Breast cancer   Breast cancer Mother    Cancer Father 32       Prostate Cancer  Prostate cancer Father    Cancer Sister 22       breast Cancer   Breast cancer Sister    Asthma Nephew    Colon cancer Neg Hx    Esophageal cancer Neg Hx    Liver cancer Neg Hx    Pancreatic cancer Neg Hx    Rectal cancer Neg Hx    Stomach cancer Neg Hx    Colon polyps Neg Hx     Allergies  Allergen Reactions   Codeine Other (See Comments)    hallucinate    Health Maintenance  Topic Date Due   COVID-19 Vaccine (1) Never done   Hepatitis C Screening  Never done   Pneumococcal Vaccine 59-52 Years old (2 of 2 - PCV) 11/13/2019   FOOT EXAM  05/03/2023   OPHTHALMOLOGY EXAM  05/10/2023   HEMOGLOBIN A1C  07/17/2023   INFLUENZA VACCINE  09/26/2023   Diabetic  kidney evaluation - Urine ACR  01/17/2024   Diabetic kidney evaluation - eGFR measurement  03/26/2024   DTaP/Tdap/Td (2 - Td or Tdap) 01/10/2028   Colonoscopy  03/13/2028   HIV Screening  Completed   Zoster Vaccines- Shingrix  Completed   HPV VACCINES  Aged Out   Meningococcal B Vaccine  Aged Out    Objective:  There were no vitals filed for this visit. There is no height or weight on file to calculate BMI.  Physical Exam Physical Exam   Lab Results Lab Results  Component Value Date   WBC 3.4 (L) 03/27/2023   HGB 10.8 (L) 03/27/2023   HCT 37.1 (L) 03/27/2023   MCV 66.8 (L) 03/27/2023   PLT 269 03/27/2023    Lab Results  Component Value Date   CREATININE 1.23 03/27/2023   BUN 11 03/27/2023   NA 141 03/27/2023   K 4.5 03/27/2023   CL 107 03/27/2023   CO2 26 03/27/2023    Lab Results  Component Value Date   ALT 16 03/27/2023   AST 27 03/27/2023   ALKPHOS 65 05/03/2022   BILITOT 0.4 03/27/2023    Lab Results  Component Value Date   CHOL 143 01/17/2023   HDL 47.70 01/17/2023   LDLCALC 82 01/17/2023   LDLDIRECT 96.0 10/18/2022   TRIG 69.0 01/17/2023   CHOLHDL 3 01/17/2023   No results found for: "LABRPR", "RPRTITER" No results found for: "HIV1RNAQUANT", "HIV1RNAVL", "CD4TABS"   Problem List Items Addressed This Visit   None  Results   Assessment/Plan -complete latent tb meds about a month ago. Took all three months. No missed odses -labs today -f/u prn -   Orlie Bjornstad, MD [facility] 06/09/2023, 3:57 PM

## 2023-06-10 LAB — COMPLETE METABOLIC PANEL WITHOUT GFR
AG Ratio: 1.4 (calc) (ref 1.0–2.5)
ALT: 12 U/L (ref 9–46)
AST: 22 U/L (ref 10–35)
Albumin: 4.1 g/dL (ref 3.6–5.1)
Alkaline phosphatase (APISO): 57 U/L (ref 35–144)
BUN: 14 mg/dL (ref 7–25)
CO2: 26 mmol/L (ref 20–32)
Calcium: 9.3 mg/dL (ref 8.6–10.3)
Chloride: 107 mmol/L (ref 98–110)
Creat: 1.33 mg/dL (ref 0.70–1.35)
Globulin: 2.9 g/dL (ref 1.9–3.7)
Glucose, Bld: 123 mg/dL — ABNORMAL HIGH (ref 65–99)
Potassium: 4.2 mmol/L (ref 3.5–5.3)
Sodium: 141 mmol/L (ref 135–146)
Total Bilirubin: 0.7 mg/dL (ref 0.2–1.2)
Total Protein: 7 g/dL (ref 6.1–8.1)

## 2023-06-10 LAB — CBC WITH DIFFERENTIAL/PLATELET
Absolute Lymphocytes: 1106 {cells}/uL (ref 850–3900)
Absolute Monocytes: 466 {cells}/uL (ref 200–950)
Basophils Absolute: 52 {cells}/uL (ref 0–200)
Basophils Relative: 1.4 %
Eosinophils Absolute: 93 {cells}/uL (ref 15–500)
Eosinophils Relative: 2.5 %
HCT: 38.8 % (ref 38.5–50.0)
Hemoglobin: 11.4 g/dL — ABNORMAL LOW (ref 13.2–17.1)
MCH: 19.6 pg — ABNORMAL LOW (ref 27.0–33.0)
MCHC: 29.4 g/dL — ABNORMAL LOW (ref 32.0–36.0)
MCV: 66.8 fL — ABNORMAL LOW (ref 80.0–100.0)
MPV: 10.3 fL (ref 7.5–12.5)
Monocytes Relative: 12.6 %
Neutro Abs: 1983 {cells}/uL (ref 1500–7800)
Neutrophils Relative %: 53.6 %
Platelets: 266 10*3/uL (ref 140–400)
RBC: 5.81 10*6/uL — ABNORMAL HIGH (ref 4.20–5.80)
RDW: 19.4 % — ABNORMAL HIGH (ref 11.0–15.0)
Total Lymphocyte: 29.9 %
WBC: 3.7 10*3/uL — ABNORMAL LOW (ref 3.8–10.8)

## 2023-06-13 DIAGNOSIS — H30033 Focal chorioretinal inflammation, peripheral, bilateral: Secondary | ICD-10-CM | POA: Diagnosis not present

## 2023-06-13 DIAGNOSIS — H3581 Retinal edema: Secondary | ICD-10-CM | POA: Diagnosis not present

## 2023-06-13 DIAGNOSIS — D869 Sarcoidosis, unspecified: Secondary | ICD-10-CM | POA: Diagnosis not present

## 2023-06-13 DIAGNOSIS — H2513 Age-related nuclear cataract, bilateral: Secondary | ICD-10-CM | POA: Diagnosis not present

## 2023-06-13 LAB — HM DIABETES EYE EXAM

## 2023-07-18 ENCOUNTER — Ambulatory Visit: Payer: BC Managed Care – PPO | Admitting: Nurse Practitioner

## 2023-08-01 ENCOUNTER — Encounter: Payer: Self-pay | Admitting: Nurse Practitioner

## 2023-08-01 ENCOUNTER — Ambulatory Visit: Admitting: Nurse Practitioner

## 2023-08-01 VITALS — BP 122/76 | HR 76 | Temp 97.8°F | Ht 69.0 in | Wt 199.8 lb

## 2023-08-01 DIAGNOSIS — E1169 Type 2 diabetes mellitus with other specified complication: Secondary | ICD-10-CM | POA: Diagnosis not present

## 2023-08-01 DIAGNOSIS — N401 Enlarged prostate with lower urinary tract symptoms: Secondary | ICD-10-CM | POA: Diagnosis not present

## 2023-08-01 DIAGNOSIS — Z23 Encounter for immunization: Secondary | ICD-10-CM

## 2023-08-01 DIAGNOSIS — R3915 Urgency of urination: Secondary | ICD-10-CM

## 2023-08-01 DIAGNOSIS — E785 Hyperlipidemia, unspecified: Secondary | ICD-10-CM | POA: Diagnosis not present

## 2023-08-01 LAB — URINALYSIS, MICROSCOPIC ONLY

## 2023-08-01 LAB — LIPID PANEL
Cholesterol: 146 mg/dL (ref 0–200)
HDL: 52.5 mg/dL (ref >39.00)
LDL Cholesterol: 81 mg/dL (ref 0–99)
NonHDL: 93.91
Total CHOL/HDL Ratio: 3
Triglycerides: 67 mg/dL (ref 0.0–149.0)
VLDL: 13.4 mg/dL (ref 0.0–40.0)

## 2023-08-01 LAB — HEMOGLOBIN A1C: Hgb A1c MFr Bld: 6.5 % (ref 4.6–6.5)

## 2023-08-01 NOTE — Patient Instructions (Signed)
 Go to lab Maintain Heart healthy diet and daily exercise. Maintain current medications.

## 2023-08-01 NOTE — Progress Notes (Signed)
 Established Patient Visit  Patient: Anthony Mills   DOB: August 15, 1960   63 y.o. Male  MRN: [id number] Visit Date: 08/01/2023  Subjective:     Chief Complaint  Patient presents with   Follow-up    6 month follow up for DM hyperlipidemia    HPI DM (diabetes mellitus) (HCC) Diet controlled Advised to schedule appointment for annual Dm eye exam. No neuropathy or nephropathy. Current use of Statin Repeat hgbA1c  Hyperlipidemia associated with type 2 diabetes mellitus (HCC) Repeat Lipid panel Maintain crestor  dose  Benign prostatic hyperplasia (BPH) with urinary urgency Last appointment with urology 1year ago Repeat PSA and urine microscope today   Reviewed medical, surgical, and social history today  Medications: Outpatient Medications Prior to Visit  Medication Sig   Multiple Vitamins-Minerals (ALIVE MENS 50+ ULTRA PO) Take 2 tablets by mouth daily.   Red Yeast Rice Extract (RED YEAST RICE PO) Take 2 capsules by mouth daily.   rosuvastatin  (CRESTOR ) 20 MG tablet Take 20 mg by mouth daily.   [DISCONTINUED] Lifitegrast (XIIDRA) 5 % SOLN Apply 1 drop to eye 2 (two) times daily. (Patient not taking: Reported on 08/01/2023)   [DISCONTINUED] rosuvastatin  (CRESTOR ) 40 MG tablet Take 1 tablet (40 mg total) by mouth daily. (Patient not taking: Reported on 08/01/2023)   [DISCONTINUED] Vitamin D , Ergocalciferol , (DRISDOL ) 1.25 MG (50000 UNIT) CAPS capsule Take 1 capsule (50,000 Units total) by mouth every 7 (seven) days. (Patient not taking: Reported on 08/01/2023)   No facility-administered medications prior to visit.   Reviewed past medical and social history.   ROS per HPI above      Objective:  BP 122/76 (BP Location: Left Arm, Patient Position: Sitting, Cuff Size: Large)   Pulse 76   Temp 97.8 F (36.6 C) (Temporal)   Ht 5\' 9"  (1.753 m)   Wt 199 lb 12.8 oz (90.6 kg)   SpO2 96%   BMI 29.51 kg/m      Physical Exam Constitutional:      Appearance: He is  obese.  Cardiovascular:     Rate and Rhythm: Normal rate and regular rhythm.     Pulses: Normal pulses.     Heart sounds: Normal heart sounds.  Pulmonary:     Effort: Pulmonary effort is normal.     Breath sounds: Normal breath sounds.  Musculoskeletal:     Right lower leg: No edema.     Left lower leg: No edema.  Neurological:     Mental Status: He is alert and oriented to person, place, and time.     No results found for any visits on 08/01/23.    Assessment & Plan:    Problem List Items Addressed This Visit     Benign prostatic hyperplasia (BPH) with urinary urgency   Last appointment with urology 1year ago Repeat PSA and urine microscope today      Relevant Orders   PSA   Urine Microscopic Only   DM (diabetes mellitus) (HCC)   Diet controlled Advised to schedule appointment for annual Dm eye exam. No neuropathy or nephropathy. Current use of Statin Repeat hgbA1c      Relevant Medications   rosuvastatin  (CRESTOR ) 20 MG tablet   Other Relevant Orders   Hemoglobin A1c   Hyperlipidemia associated with type 2 diabetes mellitus (HCC) - Primary   Repeat Lipid panel Maintain crestor  dose      Relevant Medications   rosuvastatin  (CRESTOR )  20 MG tablet   Other Relevant Orders   Lipid panel   Other Visit Diagnoses       Immunization due       Relevant Orders   Pneumococcal conjugate vaccine 20-valent (Prevnar 20)      Return in about 3 months (around 11/01/2023) for HTN, DM, hyperlipidemia (fasting).     Kathrene Parents, NP

## 2023-08-01 NOTE — Assessment & Plan Note (Addendum)
 Last appointment with urology 1year ago Repeat PSA and urine microscope today

## 2023-08-01 NOTE — Assessment & Plan Note (Signed)
 Repeat Lipid panel Maintain crestor  dose

## 2023-08-01 NOTE — Assessment & Plan Note (Addendum)
 Diet controlled Advised to schedule appointment for annual Dm eye exam. No neuropathy or nephropathy. Current use of Statin Repeat hgbA1c

## 2023-08-07 ENCOUNTER — Ambulatory Visit: Payer: Self-pay | Admitting: Nurse Practitioner

## 2023-08-07 LAB — PSA: PSA: 1.23 ng/mL (ref 0.10–4.00)

## 2023-08-26 ENCOUNTER — Ambulatory Visit: Admitting: Nurse Practitioner

## 2023-08-31 ENCOUNTER — Other Ambulatory Visit: Payer: Self-pay | Admitting: Nurse Practitioner

## 2023-11-07 ENCOUNTER — Encounter: Payer: Self-pay | Admitting: Nurse Practitioner

## 2023-11-07 ENCOUNTER — Ambulatory Visit: Admitting: Nurse Practitioner

## 2023-11-07 VITALS — BP 118/74 | HR 77 | Ht 71.0 in | Wt 202.6 lb

## 2023-11-07 DIAGNOSIS — E1169 Type 2 diabetes mellitus with other specified complication: Secondary | ICD-10-CM

## 2023-11-07 DIAGNOSIS — E785 Hyperlipidemia, unspecified: Secondary | ICD-10-CM

## 2023-11-07 DIAGNOSIS — D649 Anemia, unspecified: Secondary | ICD-10-CM | POA: Diagnosis not present

## 2023-11-07 DIAGNOSIS — Z23 Encounter for immunization: Secondary | ICD-10-CM | POA: Diagnosis not present

## 2023-11-07 DIAGNOSIS — Z227 Latent tuberculosis: Secondary | ICD-10-CM

## 2023-11-07 LAB — LDL CHOLESTEROL, DIRECT: Direct LDL: 85 mg/dL

## 2023-11-07 LAB — MICROALBUMIN / CREATININE URINE RATIO
Creatinine,U: 227.3 mg/dL
Microalb Creat Ratio: UNDETERMINED mg/g (ref 0.0–30.0)
Microalb, Ur: <0.7 mg/dL

## 2023-11-07 LAB — IRON,TIBC AND FERRITIN PANEL
%SAT: 11 % — ABNORMAL LOW (ref 20–48)
Ferritin: 8 ng/mL — ABNORMAL LOW (ref 24–380)
Iron: 50 ug/dL (ref 50–180)
TIBC: 465 ug/dL — ABNORMAL HIGH (ref 250–425)

## 2023-11-07 LAB — HEMOGLOBIN A1C: Hgb A1c MFr Bld: 6.9 % — ABNORMAL HIGH (ref 4.6–6.5)

## 2023-11-07 NOTE — Assessment & Plan Note (Signed)
Repeat direct LDL Maintain crestor dose

## 2023-11-07 NOTE — Assessment & Plan Note (Addendum)
 Completed treated with INH/rifapentine /Pyridoxine 50 mg once weekly on 02/21/24 to 05/15/23 Cleared by ID 05/2023

## 2023-11-07 NOTE — Patient Instructions (Signed)
 Go to lab Maintain Heart healthy diet and daily exercise. Maintain current medications.

## 2023-11-07 NOTE — Assessment & Plan Note (Addendum)
 Diet controlled No DIABETES retinopathy, neuropathy or nephropathy. Current use of Statin UTD with eye exam Repeat hgbA1c and UACr

## 2023-11-07 NOTE — Progress Notes (Signed)
 Established Patient Visit  Patient: Anthony Mills   DOB: [birth year]   63 y.o. Male  MRN: [id number] Visit Date: 11/07/2023  Subjective:    Chief Complaint  Patient presents with   Follow-up    FASTING 3 month follow up   HPI TB lung, latent Completed treated with INH/rifapentine /Pyridoxine 50 mg once weekly on 02/21/24 to 05/15/23 Cleared by ID 05/2023  Hyperlipidemia associated with type 2 diabetes mellitus (HCC) Repeat direct LDL Maintain crestor  dose  DM (diabetes mellitus) (HCC) Diet controlled No DIABETES retinopathy, neuropathy or nephropathy. Current use of Statin UTD with eye exam Repeat hgbA1c and UACr  Wt Readings from Last 3 Encounters:  11/07/23 202 lb 9.6 oz (91.9 kg)  08/01/23 199 lb 12.8 oz (90.6 kg)  06/09/23 200 lb (90.7 kg)    BP Readings from Last 3 Encounters:  11/07/23 118/74  08/01/23 122/76  06/09/23 120/76    Reviewed medical, surgical, and social history today  Medications: Outpatient Medications Prior to Visit  Medication Sig   OVER THE COUNTER MEDICATION Sea Moss Gummies- Chew 2 gummies a day   Multiple Vitamins-Minerals (ALIVE MENS 50+ ULTRA PO) Take 2 tablets by mouth daily.   Red Yeast Rice Extract (RED YEAST RICE PO) Take 2 capsules by mouth daily.   rosuvastatin  (CRESTOR ) 20 MG tablet TAKE 1 TABLET BY MOUTH EVERY DAY   No facility-administered medications prior to visit.   Reviewed past medical and social history.   ROS per HPI above      Objective:  BP 118/74 (BP Location: Left Arm, Patient Position: Sitting, Cuff Size: Large)   Pulse 77   Ht 5' 11 (1.803 m)   Wt 202 lb 9.6 oz (91.9 kg)   SpO2 98%   BMI 28.26 kg/m      Physical Exam Vitals and nursing note reviewed.  Cardiovascular:     Rate and Rhythm: Normal rate and regular rhythm.     Pulses: Normal pulses.     Heart sounds: Normal heart sounds.  Pulmonary:     Effort: Pulmonary effort is normal.     Breath sounds: Normal breath sounds.   Musculoskeletal:     Right lower leg: No edema.     Left lower leg: No edema.  Neurological:     Mental Status: He is alert and oriented to person, place, and time.     No results found for any visits on 11/07/23.    Assessment & Plan:    Problem List Items Addressed This Visit     DM (diabetes mellitus) (HCC) - Primary   Diet controlled No DIABETES retinopathy, neuropathy or nephropathy. Current use of Statin UTD with eye exam Repeat hgbA1c and UACr      Relevant Orders   Hemoglobin A1c   Microalbumin / creatinine urine ratio   Hyperlipidemia associated with type 2 diabetes mellitus (HCC)   Repeat direct LDL Maintain crestor  dose      Relevant Orders   Direct LDL   TB lung, latent   Completed treated with INH/rifapentine /Pyridoxine 50 mg once weekly on 02/21/24 to 05/15/23 Cleared by ID 05/2023      Other Visit Diagnoses       Need for influenza vaccination       Relevant Orders   Flu vaccine HIGH DOSE PF(Fluzone Trivalent) (Completed)     Anemia, unspecified type       Relevant Orders   Iron,  TIBC and Ferritin Panel      Return in about 3 months (around 02/06/2024) for CPE (fasting).     Roselie Mood, NP

## 2023-11-10 ENCOUNTER — Ambulatory Visit: Payer: Self-pay | Admitting: Nurse Practitioner

## 2023-11-10 DIAGNOSIS — D500 Iron deficiency anemia secondary to blood loss (chronic): Secondary | ICD-10-CM

## 2023-11-10 DIAGNOSIS — Z1211 Encounter for screening for malignant neoplasm of colon: Secondary | ICD-10-CM

## 2023-11-10 MED ORDER — IRON (FERROUS SULFATE) 325 (65 FE) MG PO TABS: 325.0000 mg | ORAL_TABLET | Freq: Every day | ORAL | Status: AC

## 2023-11-21 DIAGNOSIS — R3914 Feeling of incomplete bladder emptying: Secondary | ICD-10-CM | POA: Diagnosis not present

## 2023-11-21 DIAGNOSIS — R3916 Straining to void: Secondary | ICD-10-CM | POA: Diagnosis not present

## 2023-11-21 DIAGNOSIS — F5221 Male erectile disorder: Secondary | ICD-10-CM | POA: Diagnosis not present

## 2023-12-05 ENCOUNTER — Encounter

## 2023-12-08 ENCOUNTER — Encounter: Payer: Self-pay | Admitting: Gastroenterology

## 2023-12-19 ENCOUNTER — Encounter: Admitting: Gastroenterology

## 2024-01-16 ENCOUNTER — Encounter: Payer: Self-pay | Admitting: Gastroenterology

## 2024-01-16 ENCOUNTER — Other Ambulatory Visit (INDEPENDENT_AMBULATORY_CARE_PROVIDER_SITE_OTHER)

## 2024-01-16 ENCOUNTER — Ambulatory Visit: Admitting: Gastroenterology

## 2024-01-16 ENCOUNTER — Ambulatory Visit: Payer: Self-pay | Admitting: Gastroenterology

## 2024-01-16 VITALS — BP 120/84 | HR 74 | Ht 69.0 in | Wt 203.2 lb

## 2024-01-16 DIAGNOSIS — D509 Iron deficiency anemia, unspecified: Secondary | ICD-10-CM

## 2024-01-16 DIAGNOSIS — K5900 Constipation, unspecified: Secondary | ICD-10-CM | POA: Diagnosis not present

## 2024-01-16 DIAGNOSIS — K649 Unspecified hemorrhoids: Secondary | ICD-10-CM

## 2024-01-16 DIAGNOSIS — H30033 Focal chorioretinal inflammation, peripheral, bilateral: Secondary | ICD-10-CM | POA: Diagnosis not present

## 2024-01-16 DIAGNOSIS — Z79899 Other long term (current) drug therapy: Secondary | ICD-10-CM | POA: Diagnosis not present

## 2024-01-16 DIAGNOSIS — H3581 Retinal edema: Secondary | ICD-10-CM | POA: Diagnosis not present

## 2024-01-16 DIAGNOSIS — D869 Sarcoidosis, unspecified: Secondary | ICD-10-CM

## 2024-01-16 DIAGNOSIS — H2513 Age-related nuclear cataract, bilateral: Secondary | ICD-10-CM | POA: Diagnosis not present

## 2024-01-16 DIAGNOSIS — Z1211 Encounter for screening for malignant neoplasm of colon: Secondary | ICD-10-CM

## 2024-01-16 LAB — CBC WITH DIFFERENTIAL/PLATELET
Basophils Absolute: 0 K/uL (ref 0.0–0.1)
Basophils Relative: 1.3 % (ref 0.0–3.0)
Eosinophils Absolute: 0.2 K/uL (ref 0.0–0.7)
Eosinophils Relative: 4.9 % (ref 0.0–5.0)
HCT: 43.8 % (ref 39.0–52.0)
Hemoglobin: 14.1 g/dL (ref 13.0–17.0)
Lymphocytes Relative: 26.7 % (ref 12.0–46.0)
Lymphs Abs: 1 K/uL (ref 0.7–4.0)
MCHC: 32.1 g/dL (ref 30.0–36.0)
MCV: 77.5 fl — ABNORMAL LOW (ref 78.0–100.0)
Monocytes Absolute: 0.6 K/uL (ref 0.1–1.0)
Monocytes Relative: 17.1 % — ABNORMAL HIGH (ref 3.0–12.0)
Neutro Abs: 1.8 K/uL (ref 1.4–7.7)
Neutrophils Relative %: 50 % (ref 43.0–77.0)
Platelets: 191 K/uL (ref 150.0–400.0)
RBC: 5.66 Mil/uL (ref 4.22–5.81)
RDW: 20.5 % — ABNORMAL HIGH (ref 11.5–15.5)
WBC: 3.6 K/uL — ABNORMAL LOW (ref 4.0–10.5)

## 2024-01-16 LAB — COMPREHENSIVE METABOLIC PANEL WITH GFR
ALT: 23 U/L (ref 0–53)
AST: 26 U/L (ref 0–37)
Albumin: 4 g/dL (ref 3.5–5.2)
Alkaline Phosphatase: 66 U/L (ref 39–117)
BUN: 12 mg/dL (ref 6–23)
CO2: 31 meq/L (ref 19–32)
Calcium: 10.2 mg/dL (ref 8.4–10.5)
Chloride: 102 meq/L (ref 96–112)
Creatinine, Ser: 1.28 mg/dL (ref 0.40–1.50)
GFR: 59.61 mL/min — ABNORMAL LOW (ref >60.00)
Glucose, Bld: 104 mg/dL — ABNORMAL HIGH (ref 70–99)
Potassium: 4.6 meq/L (ref 3.5–5.1)
Sodium: 139 meq/L (ref 135–145)
Total Bilirubin: 0.6 mg/dL (ref 0.2–1.2)
Total Protein: 6.9 g/dL (ref 6.0–8.3)

## 2024-01-16 LAB — IBC + FERRITIN
Ferritin: 22.4 ng/mL (ref 22.0–322.0)
Iron: 89 ug/dL (ref 42–165)
Saturation Ratios: 23.6 % (ref 20.0–50.0)
TIBC: 376.6 ug/dL (ref 250.0–450.0)
Transferrin: 269 mg/dL (ref 212.0–360.0)

## 2024-01-16 MED ORDER — NA SULFATE-K SULFATE-MG SULF 17.5-3.13-1.6 GM/177ML PO SOLN: 1.0000 | ORAL | 0 refills | Status: AC

## 2024-01-16 NOTE — Progress Notes (Signed)
 Chief Complaint: Colonoscopy recall/hemorrhoids Primary GI MD: Dr. Leigh  HPI: Discussed the use of AI scribe software for clinical note transcription with the patient, who gave verbal consent to proceed.  Anthony Mills is a 63 year old male who presents for evaluation of hemorrhoids and scheduling of a colonoscopy.  He experiences sporadic discomfort from hemorrhoids without associated pain or bleeding. The most recent episode occurred last week, which was the first in a long time. He has a history of hemorrhoid banding last done in 2020. He would like this completed again.  He sometimes has difficulty during bowel movements and does not currently use a fiber supplement. He occasionally takes Miralax but not regularly.   His last colonoscopy in 2020 had fair prep, and he was advised to return in 2021 due to inadequate prep. His primary care provider told him he was on a five-year colonoscopy schedule due to family history of cancer, but he denies family history of colon cancer.  He does have family history of prostate cancer and breast cancer  He has been noted to have iron  deficiency and is on an iron  supplement, ferrous sulfate . He was found to have significant iron  deficiency in September 2025, and slight anemia in April 2025 with a hemoglobin level of 11.4. No pain or bleeding from hemorrhoids.   PREVIOUS GI WORKUP   Colonoscopy 02/2018 - Preparation of the colon was fair. - Internal hemorrhoids. - The examination was otherwise normal.  Overall no large polyps or mass lesions, however small or very flat lesions may not have been appreciated given preparation.  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 Koppe nodules, elevated serum ACE Formatting of this note might be different from the original. Koppe nodules, elevated serum ACE  Formatting of this note might be different from the original. Koppe nodules, elevated serum ACE Formatting of this note might be different from the original. Koppe nodules, elevated serum ACE     Testicular pain 09/23/2018    Past Surgical History:  Procedure Laterality Date   CIRCUMCISION     COLONOSCOPY     HERNIA REPAIR     KNEE SURGERY Left    left wrist surgery     MASS EXCISION N/A 10/25/2021   Procedure: EXCISION OF SCALP MASSES;  Surgeon: Paola Dreama SAILOR, MD;  Location: [facility] OR;  Service: General;  Laterality: N/A;    Current Outpatient Medications  Medication Sig Dispense Refill   Iron , Ferrous Sulfate , 325 (65 Fe) MG TABS Take 325 mg by mouth daily.     Multiple Vitamins-Minerals (ALIVE MENS 50+ ULTRA PO) Take 2 tablets by mouth daily.     Na Sulfate-K Sulfate-Mg Sulfate concentrate (SUPREP BOWEL PREP  KIT) 17.5-3.13-1.6 GM/177ML SOLN Take 1 kit (354 mLs total) by mouth as directed. 324 mL 0   OVER THE COUNTER MEDICATION Sea Moss Gummies- Chew 2 gummies a day     Red Yeast Rice Extract (RED YEAST RICE PO) Take 2 capsules by mouth daily.     rosuvastatin  (CRESTOR ) 20 MG tablet TAKE 1 TABLET BY MOUTH EVERY DAY 90 tablet 1   sildenafil (VIAGRA) 100  MG tablet Take by mouth.     tamsulosin (FLOMAX) 0.4 MG CAPS capsule Take 0.4 mg by mouth daily.     No current facility-administered medications for this visit.    Allergies as of 01/16/2024 - Review Complete 01/16/2024  Allergen Reaction Noted   Codeine Other (See Comments) 03/30/2017    Family History  Problem Relation Age of Onset   Cancer Mother 28       Breast cancer   Breast cancer Mother    Cancer Father 44       Prostate Cancer   Prostate cancer Father    Cancer Sister 55       breast Cancer   Breast cancer Sister    Asthma Nephew    Colon cancer  Neg Hx    Esophageal cancer Neg Hx    Liver cancer Neg Hx    Pancreatic cancer Neg Hx    Rectal cancer Neg Hx    Stomach cancer Neg Hx    Colon polyps Neg Hx     Social History   Socioeconomic History   Marital status: Divorced    Spouse name: Not on file   Number of children: Not on file   Years of education: Not on file   Highest education level: Not on file  Occupational History   Not on file  Tobacco Use   Smoking status: Never   Smokeless tobacco: Never  Vaping Use   Vaping status: Never Used  Substance and Sexual Activity   Alcohol  use: No   Drug use: No   Sexual activity: Yes  Other Topics Concern   Not on file  Social History Narrative   Not on file   Social Drivers of Health   Financial Resource Strain: Not on file  Food Insecurity: Low Risk  (11/22/2022)   Received from [facility]   Hunger Vital Sign    Within the past 12 months, you worried that your food would run out before you got money to buy more: Never true    Within the past 12 months, the food you bought just didn't last and you didn't have money to get more. : Never true  Transportation Needs: No Transportation Needs (11/22/2022)   Received from [facility]    In the past 12 months, has lack of reliable transportation kept you from medical appointments, meetings, work or from getting things needed for daily living? : No  Physical Activity: Not on file  Stress: Not on file  Social Connections: Unknown (07/09/2021)   Received from [facility]   Social Network    Social Network: Not on file  Intimate Partner Violence: Unknown (05/31/2021)   Received from [facility]   HITS    Physically Hurt: Not on file    Insult or Talk Down To: Not on file    Threaten Physical Harm: Not on file    Scream or Curse: Not on file    Review of Systems:    Constitutional: No weight loss, fever, chills, weakness or fatigue HEENT: Eyes: No change in vision               Ears, Nose,  Throat:  No change in hearing or congestion Skin: No rash or itching Cardiovascular: No chest pain, chest pressure or palpitations   Respiratory: No SOB or cough Gastrointestinal: See HPI and otherwise negative Genitourinary: No dysuria or change in urinary frequency Neurological: No headache, dizziness or syncope Musculoskeletal: No new muscle or  joint pain Hematologic: No bleeding or bruising Psychiatric: No history of depression or anxiety    Physical Exam:  Vital signs: BP 120/84   Pulse 74   Ht 5' 9 (1.753 m)   Wt 203 lb 4 oz (92.2 kg)   SpO2 97%   BMI 30.01 kg/m   Constitutional: NAD, alert and cooperative Head:  Normocephalic and atraumatic. Eyes:   PEERL, EOMI. No icterus. Conjunctiva pink. Respiratory: Respirations even and unlabored. Lungs clear to auscultation bilaterally.   No wheezes, crackles, or rhonchi.  Cardiovascular:  Regular rate and rhythm. No peripheral edema, cyanosis or pallor.  Gastrointestinal:  Soft, nondistended, nontender. No rebound or guarding. Normal bowel sounds. No appreciable masses or hepatomegaly. Rectal:  Declines. Defer to colonoscopy Msk:  Symmetrical without gross deformities. Without edema, no deformity or joint abnormality.  Neurologic:  Alert and  oriented x4;  grossly normal neurologically.  Skin:   Dry and intact without significant lesions or rashes. Psychiatric: Oriented to person, place and time. Demonstrates good judgement and reason without abnormal affect or behaviors.   RELEVANT LABS AND IMAGING: CBC    Component Value Date/Time   WBC 3.7 (L) 06/09/2023 1610   RBC 5.81 (H) 06/09/2023 1610   HGB 11.4 (L) 06/09/2023 1610   HCT 38.8 06/09/2023 1610   PLT 266 06/09/2023 1610   MCV 66.8 (L) 06/09/2023 1610   MCH 19.6 (L) 06/09/2023 1610   MCHC 29.4 (L) 06/09/2023 1610   RDW 19.4 (H) 06/09/2023 1610   LYMPHSABS 1.0 10/25/2018 1334   MONOABS 0.7 10/25/2018 1334   EOSABS 93 06/09/2023 1610   BASOSABS 52 06/09/2023 1610     CMP     Component Value Date/Time   NA 141 06/09/2023 1610   K 4.2 06/09/2023 1610   CL 107 06/09/2023 1610   CO2 26 06/09/2023 1610   GLUCOSE 123 (H) 06/09/2023 1610   BUN 14 06/09/2023 1610   CREATININE 1.33 06/09/2023 1610   CALCIUM  9.3 06/09/2023 1610   PROT 7.0 06/09/2023 1610   ALBUMIN 4.3 05/03/2022 0910   AST 22 06/09/2023 1610   ALT 12 06/09/2023 1610   ALKPHOS 65 05/03/2022 0910   BILITOT 0.7 06/09/2023 1610   GFRNONAA >60 10/25/2018 1334   GFRAA >60 10/25/2018 1334     Assessment/Plan:   Hemorrhoids History of grade 2 hemorrhoids s/p banding in 2020. Feels they have returned and has occasional discomfort. Some constipation - Increase water, increase fiber, increase exercise - Recommended Benefiber as a fiber supplement - Consider repeat banding s/p colonoscopy  Constipation Occasional constipation with straining overall well-controlled with MiraLAX as needed - Continue MiraLAX - Added fiber, increase water, increase exercise  Colon cancer screening Colonoscopy in 2020 with fair prep, no visible polyps, internal hemorrhoids.  Repeat 1 year - Colonoscopy with double prep -- I thoroughly discussed the procedure with the patient (at bedside) to include nature of the procedure, alternatives, benefits, and risks (including but not limited to bleeding, infection, perforation, anesthesia/cardiac pulmonary complications).  Patient verbalized understanding and gave verbal consent to proceed with procedure.   IDA Ferritin 8, saturation 11%.  Hgb 11.4.  No previous EGD.  No overt bleeding.  There are no more double booking's left in 2025 with Dr. Leigh so patient prefers to go ahead and get colonoscopy now while he has time off and pursue EGD in January - EGD/colonoscopy (colonoscopy to be done in 2025 and EGD to be done early 2026 per patient preference) - Continue iron  supplementation -  Recheck CBC, CMP, iron  studies today  Sarcoidosis  Amijah Timothy Mollie RIGGERS [facility] 01/16/2024, 9:22 AM  Cc: Katheen Roselie Rockford, NP

## 2024-01-16 NOTE — Patient Instructions (Signed)
 _______________________________________________________  If your blood pressure at your visit was 140/90 or greater, please contact your primary care physician to follow up on this.  _______________________________________________________  If you are age 63 or older, your body mass index should be between 23-30. Your Body mass index is 30.01 kg/m. If this is out of the aforementioned range listed, please consider follow up with your Primary Care Provider.  If you are age 63 or younger, your body mass index should be between 19-25. Your Body mass index is 30.01 kg/m. If this is out of the aformentioned range listed, please consider follow up with your Primary Care Provider.   ________________________________________________________  The [facility] providers would like to encourage you to use MYCHART to communicate with providers for non-urgent requests or questions.  Due to long hold times on the telephone, sending your provider a message by [facility] may be a faster and more efficient way to get a response.  Please allow 48 business hours for a response.  Please remember that this is for non-urgent requests.  _______________________________________________________  [facility] is using a team-based approach to care.  Your team is made up of your doctor and two to three APPS. Our APPS (Nurse Practitioners and Physician Assistants) work with your physician to ensure care continuity for you. They are fully qualified to address your health concerns and develop a treatment plan. They communicate directly with your gastroenterologist to care for you. Seeing the Advanced Practice Practitioners on your physician's team can help you by facilitating care more promptly, often allowing for earlier appointments, access to diagnostic testing, procedures, and other specialty referrals.   Your provider has requested that you go to the basement level for lab work before leaving today. Press B on the  elevator. The lab is located at the first door on the left as you exit the elevator.  You have been scheduled for a colonoscopy. Please follow written instructions given to you at your visit today.   If you use inhalers (even only as needed), please bring them with you on the day of your procedure.  DO NOT TAKE 7 DAYS PRIOR TO TEST- Trulicity (dulaglutide) Ozempic, Wegovy (semaglutide) Mounjaro, Zepbound (tirzepatide) Bydureon Bcise (exanatide extended release)  DO NOT TAKE 1 DAY PRIOR TO YOUR TEST Rybelsus (semaglutide) Adlyxin (lixisenatide) Victoza (liraglutide) Byetta (exanatide) ___________________________________________________________________________  It has been recommended to you by your physician that you have an endoscopy completed. Per your request, we did not schedule the procedure today. Please contact our office at [phone] when you decide to have the procedure completed.   Due to recent changes in healthcare laws, you may see the results of your imaging and laboratory studies on MyChart before your provider has had a chance to review them.  We understand that in some cases there may be results that are confusing or concerning to you. Not all laboratory results come back in the same time frame and the provider may be waiting for multiple results in order to interpret others.  Please give us  48 hours in order for your provider to thoroughly review all the results before contacting the office for clarification of your results.

## 2024-01-16 NOTE — Progress Notes (Signed)
 Agree with assessment and plan as outlined.

## 2024-01-24 DIAGNOSIS — J0110 Acute frontal sinusitis, unspecified: Secondary | ICD-10-CM | POA: Diagnosis not present

## 2024-02-04 ENCOUNTER — Encounter: Admitting: Nurse Practitioner

## 2024-02-10 ENCOUNTER — Encounter: Admitting: Gastroenterology

## 2024-02-17 DIAGNOSIS — J320 Chronic maxillary sinusitis: Secondary | ICD-10-CM | POA: Diagnosis not present

## 2024-02-17 DIAGNOSIS — J343 Hypertrophy of nasal turbinates: Secondary | ICD-10-CM | POA: Diagnosis not present

## 2024-02-17 DIAGNOSIS — J342 Deviated nasal septum: Secondary | ICD-10-CM | POA: Diagnosis not present

## 2024-03-01 ENCOUNTER — Other Ambulatory Visit: Payer: Self-pay | Admitting: Nurse Practitioner

## 2024-04-02 ENCOUNTER — Ambulatory Visit: Admitting: Nurse Practitioner

## 2024-04-23 ENCOUNTER — Encounter: Admitting: Nurse Practitioner

## 2024-05-18 ENCOUNTER — Ambulatory Visit: Admitting: Gastroenterology
